# Patient Record
Sex: Male | Born: 1970 | Race: White | Hispanic: No | Marital: Married | Smoking: Never smoker
Health system: Western US, Community
[De-identification: ages and names within clinical notes are randomized; demographics above are authoritative.]

---

## 2019-10-26 ENCOUNTER — Ambulatory Visit: Admit: 2019-10-26 | Payer: BLUE CROSS/BLUE SHIELD | Attending: Otolaryngology/Facial Plastic Surgery

## 2019-10-26 DIAGNOSIS — R590 Localized enlarged lymph nodes: Secondary | ICD-10-CM

## 2019-10-26 NOTE — Addendum Note (Signed)
Addended by: Amandy Chubbuck on: 10/26/2019 06:47 PM     Modules accepted: Orders

## 2019-10-26 NOTE — H&P (Signed)
Chief Complaint   Patient presents with   . Enlarged Lymph nodes     New Patient        HISTORY OF PRESENT ILLNESS     Alan Hughes is a 48 y.o. male who has been referred by Rolin Barry, [facility] for evaluation of a mass that was discovered approximately 6 months ago by his wife. Symptoms are described as swelling. His symptoms are located on the left neck.     The symptoms affect the patient in the following ways: uncomfortable and concerning. He denies pain but reports uncomfortable feeling like a cramped muscle. Symptoms are constant. The symptoms are aggravated by movement . The symptoms are alleviated with decreasing movement and ROM.  He reports that he has also had unexplained weight loss and the neck swelling has been enlarging. He states that he has lost 22 lbs in the last 1 month and up to 10 lbs over 5 months previous to that. Patient specifically denies pain, redness or discoloration of the skin, tenderness with eating, difficulty opening mouth, difficulty swallowing. Patient states he was talking on the phone and walking at work yesterday when he had an episode of syncope that did cause him to collapse and he lost consciousness. He started to twitch as if he was having a seizure. He was transported to ED for evaluation and that report is available in the chart. He also reports intermittent dizziness that causes him to feel a lightheaded and numbness feeling in the head and makes him shaky. He states that over the last several months he has been having severe night sweats to the point that he is drenched. He reports chills, weight loss and increased of lethargy. Small red dots have arrived on face and chest.     Previous diagnostic studies for the mass include: CT scan and  are available for chart review. He has not been evaluated by another specialist for enlarged lymph nodes.    Pertinent past medical history and surgical history include: Hypertension (medication controlled).    He uses smokeless tobacco  for the last 10 years.      RESULTS     Pertinent Imaging:              MEDICAL HX     Past Medical History:   Diagnosis Date   . Lymphadenopathy      History reviewed. No pertinent surgical history.  Family History   Problem Relation Age of Onset   . Alcohol abuse Mother    . Diabetes Mother    . Liver disease Mother    . Coronary artery disease Father    . Stroke Father    . Heart attack Father    . No Known Health Problems Son      Allergies   Allergen Reactions   . No Known Drug Allergies No known drug reaction     Outpatient Medications Marked as Taking for the 10/26/19 encounter (Office Visit) with Lorina Rabon, DO   Medication Sig Dispense Refill   . lisinopriL (ZESTRIL) 40 MG tablet Take 40 mg by mouth daily.     . methadone (DOLOPHINE) 10 MG tablet Take by mouth 2 times daily.       Social History     Socioeconomic History   . Marital status: Married     Spouse name: Not on file   . Number of children: Not on file   . Years of education: Not on file   . Highest education  level: Not on file   Tobacco Use   . Smoking status: Never Smoker   . Smokeless tobacco: Current User     Types: Chew   . Tobacco comment: no smoke exposure   Substance and Sexual Activity   . Alcohol use: Not on file   . Drug use: Not on file   . Sexual activity: Not on file   Other Topics Concern   . Not on file     Social History     Tobacco Use   Smoking Status Never Smoker   Smokeless Tobacco Current User   . Types: Chew   Tobacco Comment    no smoke exposure       REVIEW OF SYSTEMS     Review of Systems   Constitutional: Positive for diaphoresis and weight loss.   HENT: Negative.    Eyes: Negative.    Respiratory: Positive for shortness of breath.    Cardiovascular: Negative.    Musculoskeletal: Positive for back pain.   Skin: Negative.    Neurological: Positive for dizziness, focal weakness and weakness.     12 point review of systems negative except as noted above.    PHYSICAL EXAM     Body mass index is 25.5 kg/m?Marland Kitchen  Vitals:     10/26/19 1000   Temp: 36.8 ?C (98.3 ?F)   TempSrc: Temporal   Weight: 188 lb (85.3 kg)   Height: 6' (1.829 m)       PHYSICAL EXAMINATION:    CONSTITUTIONAL:  General Appearance: well nourished, well-developed, alert, oriented, in no acute distress, cooperative with examination  Communication Ability / Voice Quality : communication ability normal, voice quality normal, English     HEAD:  Inspection : normocephalic; symmetrical, no lesions present, no evidence of trauma     FACE:  Inspection : SMALL HEMANGIOMAS ACROSS THE LEFT AND RIGHT TEMPLES , no evidence of trauma, jaw position normal   Facial Strength : facial motion symmetric, normal eye closure strength bilaterally   Parotid Glands : no tenderness on palpation, no swelling present, no masses present     EYES:  Ocular Motility/Alignment : ocular alignment normal, ocular motility normal, no nystagmus present, no proptosis present   Eyelids : eyelids within normal limits, lacrimal glands within normal limits, orbits within normal limits, no proptosis present   Conjunctiva : conjunctiva normal     EARS:  Hearing : hearing to conversational voice intact   External Ears : auricles anatomically normal bilaterally, no auricle lesions present, no auricle tenderness to palpation present   External Auditory Canals: ear canals with no significant cerumen, external auditory canals have normal appearance, no external auditory canal lesions present  Otoscopic/Microscopic Exam :   Right ear: tympanic membranes appear normal, no tympanic membrane lesions present, no tympanic membrane perforations present, no fluid present behind tympanic membranes  Left ear: tympanic membranes appear normal, no tympanic membrane lesions present, no tympanic membrane perforations present, no fluid present behind tympanic membranes  Vestibular System : Normal     NOSE / NASOPHARYNX:  External Nose : appearance normal, no tenderness on palpation, no significant nasal discharge present, no lesions,  no evidence of trauma, nostrils patent without discharge   Intranasal Exam : MILD SEPTAL SHOW ON LEFT, nasal mucosa pink, moist and within normal limits, vestibule within normal limits, inferior turbinates appear normal, middle turbinates appear normal, nasal discharge is clear and minimal, no polyposis seen     ORAL CAVITY / OROPHARYNX:  Lips : upper  and lower lips pink, moist, and normal appearing.   Teeth : CARIES LEFT PREMOLAR ON MANDIBLE dentition within normal limits for age   Gums : gingivae healthy   Oral Mucosa : oral mucosa pink and moist with no lesions present   Floor of Mouth : floor of mouth within normal limits, salivary ducts appear patent   Tongue : tongue moist, midline, with symmetrical movements with no lesions seen.   Palate : soft and hard palates within normal limits   Oropharynx : appearance within normal limits, tonsils normal in appearance, peritonsillar regions within normal limits     NECK:  Inspection and Palpation : LEFT POSTERIOR TRIANGLE LEVEL 3 LYMPH NODE  appearance normal, no masses or tenderness on palpation; trachea midline and freely movable   Thyroid : size of gland normal, no tenderness, nodules or mass present on palpation, position midline   Submandibular Glands : normal size, nontender to palpation   Lymph Nodes : no lymphadenopathy present     SKIN AND SUBCUTANEOUS TISSUES:  Skin : normal coloration and skin turgor for age, no rashes     NEUROLOGICAL / PSYCHIATRIC:  Orientation : oriented to time, place and person   Mood and Affect : mood normal, affect appropriate   Cranial Nerves : CN II-XII appear intact   Gait : gait normal     IN OFFICE PROCEDURES     NONE    ASSESSMENT       ICD-10-CM    1. Enlarged lymph node in neck  R59.0 Thyroid Stimulating Hormone -Routine     T4, Free -Routine   2. No passive smoke exposure  Z78.9    3. Lethargy  R53.83 Thyroid Stimulating Hormone -Routine     T4, Free -Routine        PLAN     1. Schedule excisional biopsy of left neck.  Procedure explained in detail including pre-op and post-op expectations. PARQ session was held: Risks and benefits were explained in full detail at that time to include, but not inclusive of bleeding, infection, hematoma, seroma,nerve damage, anesthesia risks need for secondary operation.  2. Reviewed CT scan and ultrasound performed at [facility]. Further imaging may need for full body scan depending on if biopsy comes back negative.   3. Order placed for TSH and T4 for further evaluation.     Patient instructions were provided and all questions answered.    Return for surgery.    I, Ernestyne Caldwell Allayne Butcher, DO, personally performed the services described in this documentation, as scribed by Elige Radon, CCMA in my presence, and it is both accurate and complete.    *This note was dictated using voice recognition software. If you have any questions concerning the content, please call our office at [phone].*

## 2019-10-27 NOTE — Procedures (Signed)
H&P dated 10/26/19, PARQ included, in chart review, notes.  Consent completed by patient and witness, in chart review, scans/media.  Orders in place.      Pt denies chest pain, difficulty with a flight of stairs, and orthopnea.    Pt denies cough, fever, respiratory symptoms.  Also denies recent travels, exposure to persons with COVID-19.    COVID screening test, TSH, T4 to be obtained on Mon 12/14 @ [facility]

## 2019-10-30 ENCOUNTER — Other Ambulatory Visit: Admit: 2019-10-30 | Discharge: 2019-10-30 | Payer: BLUE CROSS/BLUE SHIELD

## 2019-10-30 DIAGNOSIS — Z01818 Encounter for other preprocedural examination: Secondary | ICD-10-CM

## 2019-10-30 LAB — TSH: TSH - Thyroid Stimulating Hormone: 0.98 u[IU]/mL (ref 0.45–5.33)

## 2019-10-30 LAB — T4, FREE: T4, Free: 1 ng/dL (ref 0.6–1.2)

## 2019-10-30 LAB — SARS-COV-2 (COVID-19), SCREENING/ASYMPTOMATIC UNEXPOSED: SARS-CoV-2 (COVID-19) by RT-PCR: NOT DETECTED

## 2019-10-31 NOTE — Telephone Encounter (Signed)
Patient called in to find out if he will need hypaclean to wash the area before his biopsy tomorrow 11/01/19. If so, a member staff at [facility] can supply it to him if needed.    Please call back at [phone]    Talked to [place] MA via blue phone who advised that he does not need it.

## 2019-11-01 LAB — TISSUE EXAM

## 2019-11-01 MED ORDER — ondansetron (ZOFRAN) injection 4 mg
4 | Freq: Four times a day (QID) | INTRAMUSCULAR | Status: DC | PRN
Start: 2019-11-01 — End: 2019-11-01

## 2019-11-01 MED ORDER — ondansetron (ZOFRAN ODT) 4 MG disintegrating tablet
4 | ORAL_TABLET | Freq: Three times a day (TID) | ORAL | 0 refills | Status: DC | PRN
Start: 2019-11-01 — End: 2019-11-01

## 2019-11-01 MED ORDER — propofoL (DIPRIVAN) injection
10 | INTRAVENOUS | Status: DC | PRN
Start: 2019-11-01 — End: 2019-11-01
  Administered 2019-11-01: 17:00:00 10 mg/mL via INTRAVENOUS

## 2019-11-01 MED ORDER — HYDROcodone-acetaminophen (NORCO) 5-325 mg per tablet
5-325 | ORAL_TABLET | Freq: Four times a day (QID) | ORAL | 0 refills | 30.00000 days | Status: DC | PRN
Start: 2019-11-01 — End: 2019-11-01

## 2019-11-01 MED ORDER — diphenhydrAMINE (BENADRYL) injection 25 mg
50 | INTRAMUSCULAR | Status: DC | PRN
Start: 2019-11-01 — End: 2019-11-01

## 2019-11-01 MED ORDER — fentaNYL (PF) (SUBLIMAZE) injection 25-50 mcg
50 | INTRAMUSCULAR | Status: DC | PRN
Start: 2019-11-01 — End: 2019-11-01

## 2019-11-01 MED ORDER — ondansetron (ZOFRAN) injection
4 | INTRAMUSCULAR | Status: DC | PRN
Start: 2019-11-01 — End: 2019-11-01
  Administered 2019-11-01: 18:00:00 4 mg/2 mL via INTRAVENOUS

## 2019-11-01 MED ORDER — ketorolac (TORADOL) 15 mg/mL injection 15 mg
15 | Freq: Once | INTRAMUSCULAR | Status: DC
Start: 2019-11-01 — End: 2019-11-01

## 2019-11-01 MED ORDER — ketorolac (TORADOL) 15 mg/mL injection
15 | INTRAMUSCULAR | Status: AC
Start: 2019-11-01 — End: ?

## 2019-11-01 MED ORDER — ondansetron (ZOFRAN ODT) 4 MG disintegrating tablet
4 | ORAL_TABLET | Freq: Three times a day (TID) | ORAL | 0 refills | Status: AC | PRN
Start: 2019-11-01 — End: 2019-11-08

## 2019-11-01 MED ORDER — sodium chloride 0.9 % (NS flush) syringe 10 mL
INTRAMUSCULAR | Status: DC | PRN
Start: 2019-11-01 — End: 2019-11-01

## 2019-11-01 MED ORDER — lidocaine (PF) (XYLOCAINE) 20 mg/mL (2 %) injection
20 | INTRAMUSCULAR | Status: AC
Start: 2019-11-01 — End: ?

## 2019-11-01 MED ORDER — fentaNYL (PF) (SUBLIMAZE) injection 50-100 mcg
50 | INTRAMUSCULAR | Status: DC | PRN
Start: 2019-11-01 — End: 2019-11-01

## 2019-11-01 MED ORDER — ondansetron (ZOFRAN) 4 mg/2 mL injection
4 | INTRAMUSCULAR | Status: AC
Start: 2019-11-01 — End: ?

## 2019-11-01 MED ORDER — Lactated Ringers infusion
INTRAVENOUS | Status: DC
Start: 2019-11-01 — End: 2019-11-01
  Administered 2019-11-01 (×2): via INTRAVENOUS

## 2019-11-01 MED ORDER — lidocaine (PF) (XYLOCAINE) 20 mg/mL (2 %) injection
20 | INTRAMUSCULAR | Status: DC | PRN
Start: 2019-11-01 — End: 2019-11-01
  Administered 2019-11-01: 17:00:00 20 mg/mL (2 %) via INTRAVENOUS

## 2019-11-01 MED ORDER — meperidine (PF) (DEMEROL) injection 12.5 mg
25 | INTRAMUSCULAR | Status: DC | PRN
Start: 2019-11-01 — End: 2019-11-01

## 2019-11-01 MED ORDER — lidocaine-EPINEPHrine (XYLOCAINE W/EPI) 1 %-1:100,000 injection
1 | INTRAMUSCULAR | Status: DC | PRN
Start: 2019-11-01 — End: 2019-11-01
  Administered 2019-11-01: 18:00:00 1 %-:00,000 via INTRADERMAL

## 2019-11-01 MED ORDER — dexAMETHasone (DECADRON) 4 mg/mL injection
4 | INTRAMUSCULAR | Status: AC
Start: 2019-11-01 — End: ?

## 2019-11-01 MED ORDER — HYDROcodone-acetaminophen (NORCO) 5-325 mg per tablet
5-325 | ORAL_TABLET | Freq: Four times a day (QID) | ORAL | 0 refills | 30.00000 days | Status: AC | PRN
Start: 2019-11-01 — End: 2019-11-08

## 2019-11-01 MED ORDER — sodium chloride 0.9 % (NS flush) syringe 10 mL
INTRAMUSCULAR | Status: DC
Start: 2019-11-01 — End: 2019-11-01

## 2019-11-01 MED ORDER — dexAMETHasone (DECADRON) injection
4 | INTRAMUSCULAR | Status: DC | PRN
Start: 2019-11-01 — End: 2019-11-01
  Administered 2019-11-01: 17:00:00 4 mg/mL via INTRAVENOUS

## 2019-11-01 MED ORDER — Lactated Ringers infusion
INTRAVENOUS | Status: AC
Start: 2019-11-01 — End: ?

## 2019-11-01 MED ORDER — ketorolac (TORADOL) 15 mg/mL injection
15 | INTRAMUSCULAR | Status: DC | PRN
Start: 2019-11-01 — End: 2019-11-01
  Administered 2019-11-01: 18:00:00 15 mg/mL via INTRAVENOUS

## 2019-11-01 MED ORDER — propofoL (DIPRIVAN) 10 mg/mL injection
10 | INTRAVENOUS | Status: AC
Start: 2019-11-01 — End: ?

## 2019-11-01 MED ORDER — sodium chloride (NS) 0.9% irrigation
0.9 | Status: DC | PRN
Start: 2019-11-01 — End: 2019-11-01
  Administered 2019-11-01: 18:00:00 0.9 % irrigation

## 2019-11-01 MED FILL — LACTATED RINGERS INTRAVENOUS SOLUTION: INTRAVENOUS | Qty: 1000

## 2019-11-01 MED FILL — XYLOCAINE-MPF 20 MG/ML (2 %) INJECTION SOLUTION: 20 mg/mL (2 %) | INTRAMUSCULAR | Qty: 5

## 2019-11-01 MED FILL — ONDANSETRON HCL (PF) 4 MG/2 ML INJECTION SOLUTION: 4 | INTRAMUSCULAR | Qty: 2

## 2019-11-01 MED FILL — DIPRIVAN 10 MG/ML INTRAVENOUS EMULSION: 10 mg/mL | INTRAVENOUS | Qty: 20

## 2019-11-01 MED FILL — DEXAMETHASONE SODIUM PHOSPHATE 4 MG/ML INJECTION SOLUTION: 4 mg/mL | INTRAMUSCULAR | Qty: 1

## 2019-11-01 MED FILL — KETOROLAC 15 MG/ML INJECTION SOLUTION: 15 mg/mL | INTRAMUSCULAR | Qty: 15

## 2019-11-01 NOTE — Op Note (Signed)
DATE OF PROCEDURE:  11/01/2019    PROCEDURE:  Deep neck lymph node biopsy of left posterior triangle of the left   neck.    PREOPERATIVE DIAGNOSES:  1.  Enlarged lymph node of the left neck.  2.  Night sweats, weight loss and chills.    POSTOPERATIVE DIAGNOSES:  1.  Enlarged lymph node of the left neck.  2.  Night sweats, weight loss and chills.    SURGEON:  Lorina Rabon, DO.    ANESTHESIA PROVIDER:  Olin Pia, DO.    SPECIMEN:  Posterior triangle, left neck mass.    INCISION TIME:  0913    END TIME:  0933.    ESTIMATED BLOOD LOSS:  Minimal.    FINDINGS:  Left neck enlarged lymph node.    COMPLICATIONS:  None.    DISPOSITION:  To PACU in a hemodynamically stable condition.    PREOPERATIVE:  The patient was seen and examined in outpatient setting.  It was   determined at that time, the patient would benefit from the aforementioned   procedure.  Risks and benefits of the procedure explained in full detail at the   time including, but not inclusive of, bleeding, infection, hematoma, seroma,   nerve damage, need for secondary operation.    DESCRIPTION OF PROCEDURE:  On the day surgery, consent was signed and placed in   chart.  The patient was then brought back by anesthesia, placed on the OP table   in the supine position, administered appropriate general endotracheal   anesthesia.  Left neck lymph node was demarcated.  Lidocaine 1% with epinephrine  was injected below this demarcation in the dermal layer.  The left neck was then  prepped and draped in normal sterile fashion.    A 3.5 cm incision was carried out through the skin and subcutaneous tissue and   through the deep tissues of the superficial adipose tissue.  From here, tenotomy  scissors were used to spread adipose tissue through the approximate layer of   platysma.  This is where the accessory nerve was encountered and preserved.    Anterior to the accessory nerve, the lymph node was palpated and dissection   bluntly down to this with the tenotomy  scissors was accomplished.    Circumferentially, the lymph node was mobilized from the surrounding soft   tissue.  Using bipolar forceps and tenotomy scissors, this was released from the  surrounding fascia.  The wound was irrigated with copious amounts of saline.    The wound was reapproximated on the deep surface with 3-0 interrupted Vicryl   sutures x3.  Deep dermis was reapproximated using 4-0 interrupted Vicryl suture.   Skin glue, Steri-Strips and pressure dressing were then placed.  The patient   was awakened in the OR to regain reflexes, extubated and sent to postanesthesia   care unit in a stable condition.    Jerine Surles, DO    RH/nlrowemt  D:  11/01/2019  2:27 P   T:  11/01/2019  5:29 P   TID:  [id number]  RECEIPT:    [id number]

## 2019-11-01 NOTE — H&P (Signed)
No interval change to H&P for date of surgery

## 2019-11-01 NOTE — Discharge Instructions (Signed)
Patient Education   Excision of Lesions, Care After  Refer to this sheet in the next few weeks. These instructions provide you with information about caring for yourself after your procedure. Your health care provider may also give you more specific instructions. Your treatment has been planned according to current medical practices, but problems sometimes occur. Call your health care provider if you have any problems or questions after your procedure.  What can I expect after the procedure?  After your procedure, it is common to have pain or discomfort at the excision site.  Follow these instructions at home:  ? Take over-the-counter and prescription medicines only as told by your health care provider.  ? Follow instructions from your health care provider about:  ? How to take care of your excision site. You should keep the site clean, dry, and protected for at least 48 hours.  ? When and how you should change your bandage (dressing).  ? When you should remove your dressing.  ? Removing whatever was used to close your excision site.  ? Check the excision area every day for signs of infection. Watch for:  ? Redness, swelling, or pain.  ? Fluid, blood, or pus.  ? For bleeding, apply gentle but firm pressure to the area using a folded towel for 20 minutes.  ? Avoid high-impact exercise and activities until the stitches (sutures) are removed or the area heals.  ? Follow instructions from your health care provider about how to minimize scarring. Avoid sun exposure until the area has healed. Scarring should lessen over time.  ? Keep all follow-up visits as told by your health care provider. This is important.  Contact a health care provider if:  ? You have a fever.  ? You have redness, swelling, or pain at the excision site.  ? You have fluid, blood, or pus coming from the excision site.  ? You have ongoing bleeding at the excision site.  ? You have pain that does not improve in 2-3 days after your procedure.  ? You  notice skin irregularities or changes in sensation.  This information is not intended to replace advice given to you by your health care provider. Make sure you discuss any questions you have with your health care provider.  Document Released: 03/19/2015 Document Revised: 04/09/2016 Document Reviewed: 12/19/2014  Elsevier Interactive Patient Education ? 2019 Elsevier Inc[employer]     Patient Education     Moderate Conscious Sedation, Adult, Care After  These instructions provide you with information about caring for yourself after your procedure. Your health care provider may also give you more specific instructions. Your treatment has been planned according to current medical practices, but problems sometimes occur. Call your health care provider if you have any problems or questions after your procedure.  What can I expect after the procedure?  After your procedure, it is common:  ? To feel sleepy for several hours.  ? To feel clumsy and have poor balance for several hours.  ? To have poor judgment for several hours.  ? To vomit if you eat too soon.  Follow these instructions at home:  For at least 24 hours after the procedure:    ? Do not:  ? Participate in activities where you could fall or become injured.  ? Drive.  ? Use heavy machinery.  ? Drink alcohol.  ? Take sleeping pills or medicines that cause drowsiness.  ? Make important decisions or sign legal documents.  ? Take care  of children on your own.  ? Rest.  Eating and drinking  ? Follow the diet recommended by your health care provider.  ? If you vomit:  ? Drink water, juice, or soup when you can drink without vomiting.  ? Make sure you have little or no nausea before eating solid foods.  General instructions  ? Have a responsible adult stay with you until you are awake and alert.  ? Take over-the-counter and prescription medicines only as told by your health care provider.  ? If you smoke, do not smoke without supervision.  ? Keep all follow-up visits as told by  your health care provider. This is important.  Contact a health care provider if:  ? You keep feeling nauseous or you keep vomiting.  ? You feel light-headed.  ? You develop a rash.  ? You have a fever.  Get help right away if:  ? You have trouble breathing.  This information is not intended to replace advice given to you by your health care provider. Make sure you discuss any questions you have with your health care provider.  Document Released: 08/23/2013 Document Revised: 04/06/2016 Document Reviewed: 02/22/2016  Elsevier Interactive Patient Education ? 2019 Elsevier Inc.

## 2019-11-01 NOTE — Anesthesia Pre-Procedure Evaluation (Signed)
Anesthesia Evaluation     Patient summary reviewed and Nursing notes reviewed    No history of anesthetic complications     Airway   Mallampati: II  TM distance: 6.5 - 8 cm  Neck ROM: full  Dental    (+) age appropriate    Pulmonary - negative ROS and normal exam    breath sounds clear to auscultation  Cardiovascular - normal exam  Exercise tolerance: good (4-7 METS)  (+) hypertension,     Rhythm: regular  Rate: normal    Neuro/Psych - negative ROS     GI/Hepatic/Renal - negative ROS     Endo/Other - negative ROS     Comments: On methadone   Musculoskeletal - negative ROS                   Anesthesia Plan    ASA 2     general     intravenous induction         Anesthetic plan, risks and benefits discussed with patient.  Risks discussed included (but were not limited to)   General risks dental injury, nausea, pain, sore throat and respiratory eventsConsenting person understands and agrees to proceed. PARQ.  Pre-Anesthesia Evaluation Completed at:  11/01/2019 8:45 AM

## 2019-11-01 NOTE — OR Nursing (Addendum)
Pt arrived in recovery and report taken from OR nurse and anesthesia. VS obtained, WNL, and pt stable throughout recovery. Pt drinking and tolerating fluids w/no nausea. Pt stable for discharge home and all instructions reviewed w/patient verbally and written instructions given. Pt voices understanding. All questions answered. Pressure dressing removed from left neck per surgeon's request prior to discharge. Pt dressed with minimal assist, and is steady, balanced with being up. Discharging per protocol with all belongings. Pt ambulating to car w/one person SBA.

## 2019-11-01 NOTE — Brief Op Note (Addendum)
Brief Operative Note    Procedure(s) (LRB):  BIOPSY NECK SOFT TISSUE (Left)     Preoperative Diagnoses:   Enlarged lymph node in neck [R59.0]    Postoperative Diagnoses:  Post-Op Diagnosis Codes:     * Enlarged lymph node in neck [R59.0]     Surgeons: Surgeon(s) and Role:     * Lorina Rabon, DO - Primary    Assistant(s): * No surgical staff found *     Anesthesia Provider: Anesthesiologist: Olin Pia, DO    Anesthesia Type: Multiple (Refer to Case Comments)    Height & Weight:6' (1.829 m) & 185 lb (83.9 kg)     Specimens:   ID Type Source Tests Collected by Time Destination   A : Left neck soft tissue for flow cytometry Tissue Soft Tissue (specify site) TISSUE EXAM Lorina Rabon, DO 11/01/2019 1610               Implants:none    Antibiotics (admin):   Recent Abx Admin      No antibiotic adminstration found                Incision Time:   Anes Incision Time     Date Time Event    11/01/2019 0913 Incision / Procedure Start           Timeout Completed:  Timeouts     [name], RN at [facility] Nov 01, 2019 0908 PST     Timeout Details     Timeout type: Pre-incision                [name], RN at [facility] Nov 01, 2019 9604 PST     Timeout Details     Timeout type: Sign-out                       Estimated Blood Loss: * minimal - 11/01/2019  9:13 AM to 11/01/2019  9:33 AM *    Urethral Catheter:      Findings: left neck enlarged lymph node    Complications: None     Disposition: PACU - hemodynamically stable.  [id number]  Lorina Rabon

## 2019-11-01 NOTE — Anesthesia Post-Procedure Evaluation (Signed)
Patient Name: Alan Hughes  Procedures performed: Procedure(s):  BIOPSY NECK SOFT TISSUE    Last Vitals:   Vitals Value Taken Time   BP 107/58 11/01/19 0939   Pulse 65 11/01/19 0940   Resp 8 11/01/19 0940   SpO2 100 % 11/01/19 0940   Temp     Vitals shown include unvalidated device data.    Planned Anesthesia Type: general  Final Anesthesia Type: general  Patients Current Location: PACU  Level of Consciousness: awake and responds appropriately  Post Procedure Pain:adequate analgesia  Airway: Patent  Respiratory Status: room air  Cardio Status: hemodynamically stable  Hydration: adequately hydrated  PONV prophylaxis Ordered  no anesthesia complication,       planned opioid use           The patient was able to participate in the post op evaluation    Comments:      Olin Pia, DO  9:43 AM

## 2019-11-02 ENCOUNTER — Other Ambulatory Visit: Admit: 2019-11-02 | Discharge: 2019-11-02 | Payer: BLUE CROSS/BLUE SHIELD

## 2019-11-02 DIAGNOSIS — R590 Localized enlarged lymph nodes: Secondary | ICD-10-CM

## 2019-11-02 NOTE — Telephone Encounter (Signed)
Care call placed to patient following excisional biopsy of left neck performed on 11/01/19.    Left message on voicemail asking for return call if there are any questions or concerns regarding post-op care.    Patient is scheduled for post op visit on 11/13/19 with Dr. Allayne Butcher.

## 2019-11-13 ENCOUNTER — Encounter: Admit: 2019-11-13 | Payer: BLUE CROSS/BLUE SHIELD | Attending: Otolaryngology/Facial Plastic Surgery

## 2019-11-13 DIAGNOSIS — Z4889 Encounter for other specified surgical aftercare: Secondary | ICD-10-CM

## 2019-11-13 MED ORDER — amoxicillin-clavulanate (AUGMENTIN) 875-125 mg per tablet
875-125 | ORAL_TABLET | Freq: Two times a day (BID) | ORAL | 0 refills | 7.00000 days | Status: AC
Start: 2019-11-13 — End: 2019-11-27

## 2019-11-13 NOTE — Progress Notes (Signed)
Chief Complaint   Patient presents with   . Post-Op Visit     s/p excisional biopsy left neck lymph node 11/01/19     HISTORY OF PRESENT ILLNESS     Alan Hughes is a/an 48 y.o. male here s/p excisional biopsy of left neck enlarged lymph node performed by Dr. Allayne Butcher on 11/01/19  The surgical pathology demonstrates some reactive, nonspecific paracortical hyperplasia. No malignancy seen. He used/has been using pain medication for 0 days. He does not need a refill of pain medication. Patient reports symptoms today of fatigue.  Patient is here today for review of pathology and determine if further work-up or treatment is required.    MEDICAL HISTORY     Past Medical History:   Diagnosis Date   . Chronic pain     back   . Hypertension    . Lymphadenopathy      Past Surgical History:   Procedure Laterality Date   . BACK SURGERY      X4, lumbar   . COLONOSCOPY  2011   . ESOPHAGOGASTRODUODENOSCOPY  2011   . FOOT SURGERY Left     2nd and third toe partial removal d/t prior fx   . KNEE ARTHROSCOPY Right    . PROCEDURE Left 11/01/2019    Procedure: BIOPSY NECK SOFT TISSUE;  Surgeon: Lorina Rabon, DO;  Location: [facility] OR;  Service: Ear Nose and Throat;  Laterality: Left;     Family History   Problem Relation Age of Onset   . Alcohol abuse Mother    . Diabetes Mother    . Liver disease Mother    . Coronary artery disease Father    . Stroke Father    . Heart attack Father    . No Known Health Problems Son      Allergies   Allergen Reactions   . No Known Drug Allergies No known drug reaction     Outpatient Medications Marked as Taking for the 11/13/19 encounter (Post-Op) with Lorina Rabon, DO   Medication Sig Dispense Refill   . lisinopriL (ZESTRIL) 40 MG tablet Take 20 mg by mouth daily.      . methadone (DOLOPHINE) 10 MG tablet Take by mouth 2 times daily.       Social History     Socioeconomic History   . Marital status: Married     Spouse name: Not on file   . Number of children: Not on file   . Years of  education: Not on file   . Highest education level: Not on file   Tobacco Use   . Smoking status: Never Smoker   . Smokeless tobacco: Current User     Types: Chew   . Tobacco comment: no smoke exposure   Substance and Sexual Activity   . Alcohol use: Never     Frequency: Never   . Drug use: Never   . Sexual activity: Not on file   Other Topics Concern   . Not on file     Social History     Tobacco Use   Smoking Status Never Smoker   Smokeless Tobacco Current User   . Types: Chew   Tobacco Comment    no smoke exposure       REVIEW OF SYSTEMS     ROS    PHYSICAL EXAM     Body mass index is 25.09 kg/m?Marland Kitchen  Vitals:    11/13/19 1117   Temp: 36.6 ?C (97.9 ?F)   TempSrc:  Temporal   Weight: 185 lb (83.9 kg)   Height: 6' (1.829 m)       PHYSICAL EXAMINATION:    CONSTITUTIONAL:  General Appearance: well nourished, well-developed, alert, oriented, in no acute distress, cooperative with examination  Communication Ability / Voice Quality : communication ability normal, voice quality normal, English     FACE:  Inspection : normal appearance, no lesions present, no evidence of trauma, jaw position normal   Palpation : frontoethmoidal sinus nontender, maxillary sinuses nontender to palpation, no masses present   Facial Strength : facial motion symmetric, normal eye closure strength bilaterally   Parotid Glands : no tenderness on palpation, no swelling present, no masses present     EARS:  Hearing : hearing to conversational voice intact   External Ears : auricles anatomically normal bilaterally, no auricle lesions present, no auricle tenderness to palpation present   External Auditory Canals: ear canals with no significant cerumen, external auditory canals have normal appearance, no external auditory canal lesions present  Otoscopic/Microscopic Exam :   Right ear: tympanic membranes appear normal, no tympanic membrane lesions present, no tympanic membrane perforations present, no fluid present behind tympanic membranes  Left ear:  tympanic membranes appear normal, no tympanic membrane lesions present, no tympanic membrane perforations present, no fluid present behind tympanic membranes  Vestibular System : Normal     NOSE / NASOPHARYNX:  External Nose : appearance normal, no tenderness on palpation, no significant nasal discharge present, no lesions, no evidence of trauma, nostrils patent without discharge   Intranasal Exam : nasal mucosa pink, moist and within normal limits, vestibule within normal limits, inferior turbinates appear normal, middle turbinates appear normal, nasal septum relatively midline, nasal discharge is clear and minimal, no polyposis seen     ORAL CAVITY / OROPHARYNX:  Lips : upper and lower lips pink, moist, and normal appearing.   Teeth : dentition within normal limits for age   Gums : gingivae healthy   Oral Mucosa : oral mucosa pink and moist with no lesions present   Floor of Mouth : floor of mouth within normal limits, salivary ducts appear patent   Tongue : tongue moist, midline, with symmetrical movements with no lesions seen.   Palate : soft and hard palates within normal limits   Oropharynx : SMALL AMOUNT OF PURULENCE NOTED IN THE RIGHT OROPHARYNX.    NECK:  Inspection and Palpation : INCISION HEALING WELL IN THE LEFT POSTERIOR TRIANGLE OF NECK.  POSTOPERATIVE SWELLING NOTED AND APPROPRIATE FOR TIMEFRAME.  Thyroid : size of gland normal, no tenderness, nodules or mass present on palpation, position midline   Submandibular Glands : normal size, nontender to palpation   Lymph Nodes : no lymphadenopathy present     RESULTS     Tissue Exam (Surgical Pathology): [id number]  Order: [id number]  Collected:  11/01/2019 09:22   Status:  Edited Result - FINAL   Visible to patient:  No (not released)  Component    Final Pathologic Diagnosis   LYMPH NODE, LEFT NECK, EXCISION:    REACTIVE LYMPH NODE, SEE COMMENT.  ?  Comment:       The biopsy shows some reactive, nonspecific paracortical hyperplasia.  No malignancy is seen.   Recommend clinical correlation.       ?  ADDENDUM COMMENT (11/07/2019):  Flow cytometric from [facility] showed no immunophenotypic evidence of non-Hodgkin's lymphoma. Diagnosis unchanged.  ?   Amendment electronically signed by Wynona Dove, MD on 11/07/2019 at 1141   Electronically  signed by Wynona Dove, MD on 11/03/2019 at 1723           Labs:    TSH - Thyroid Stimulating Hormone 0.45 - 5.33 ?IU/mL 0.98       Specimen Collected: 10/30/19        T4, Free 0.6 - 1.2 ng/dL 1.0    Specimen Collected 10/30/19         IN OFFICE PROCEDURES       ASSESSMENT       ICD-10-CM    1. Postoperative visit  Z48.89    2. History of lymph node excision  Z98.890    3. Unexplained weight loss  R63.4    4. Other chronic sinusitis  J32.8 amoxicillin-clavulanate (AUGMENTIN) 875-125 mg per tablet     CT sinus without contrast stealth   5. No passive smoke exposure  Z78.9      PLAN     1.  Reviewed pathology with patient.  It was a benign reactive lymph node.  2.  Is unexplained weight loss and loss of appetite may be attributed to chronic sinus infection.  I will place him on Augmentin 875 twice daily x14 days.  After which we will obtain a CT scan of paranasal sinuses.  3.  Patient is moving to [place] in the next 3 to 4 weeks.  He was instructed to obtain a hard copy of the images in the form of a CD to take with him to [place].     Patient instructions were provided and all questions answered.    No follow-ups on file.    I, Evelette Hollern Allayne Butcher, DO, personally performed the services described in this documentation, and it is both accurate and complete.    *This note was dictated using voice recognition software. If you have any questions concerning the content, please call our office at [phone] .*

## 2019-11-14 NOTE — Telephone Encounter (Signed)
Patients wife Annice Pih called in regarding CT scan. She advised that [facility] has not received order.     Please call back at [phone]. Okay to leave a detailed message.

## 2019-11-14 NOTE — Telephone Encounter (Signed)
LM on VM informing that at this time we are still waiting on insurance authorization. Advised that scheduling will call patient to schedule once approved. Call if further questions.

## 2019-11-30 ENCOUNTER — Encounter: Payer: BLUE CROSS/BLUE SHIELD | Attending: Otolaryngology/Facial Plastic Surgery

## 2020-02-14 IMAGING — MG MAMMO DIAG BILAT DIGITAL W CAD
4 series · 4 of 4 positions shown · non-contrast
Comparison: None available.

INDICATION: Right breast lump.
TECHNIQUE: Bilateral 2-D digital diagnostic mammogram was performed. Current study was also evaluated with a computer aided detection (CAD) system.

[R MLO]
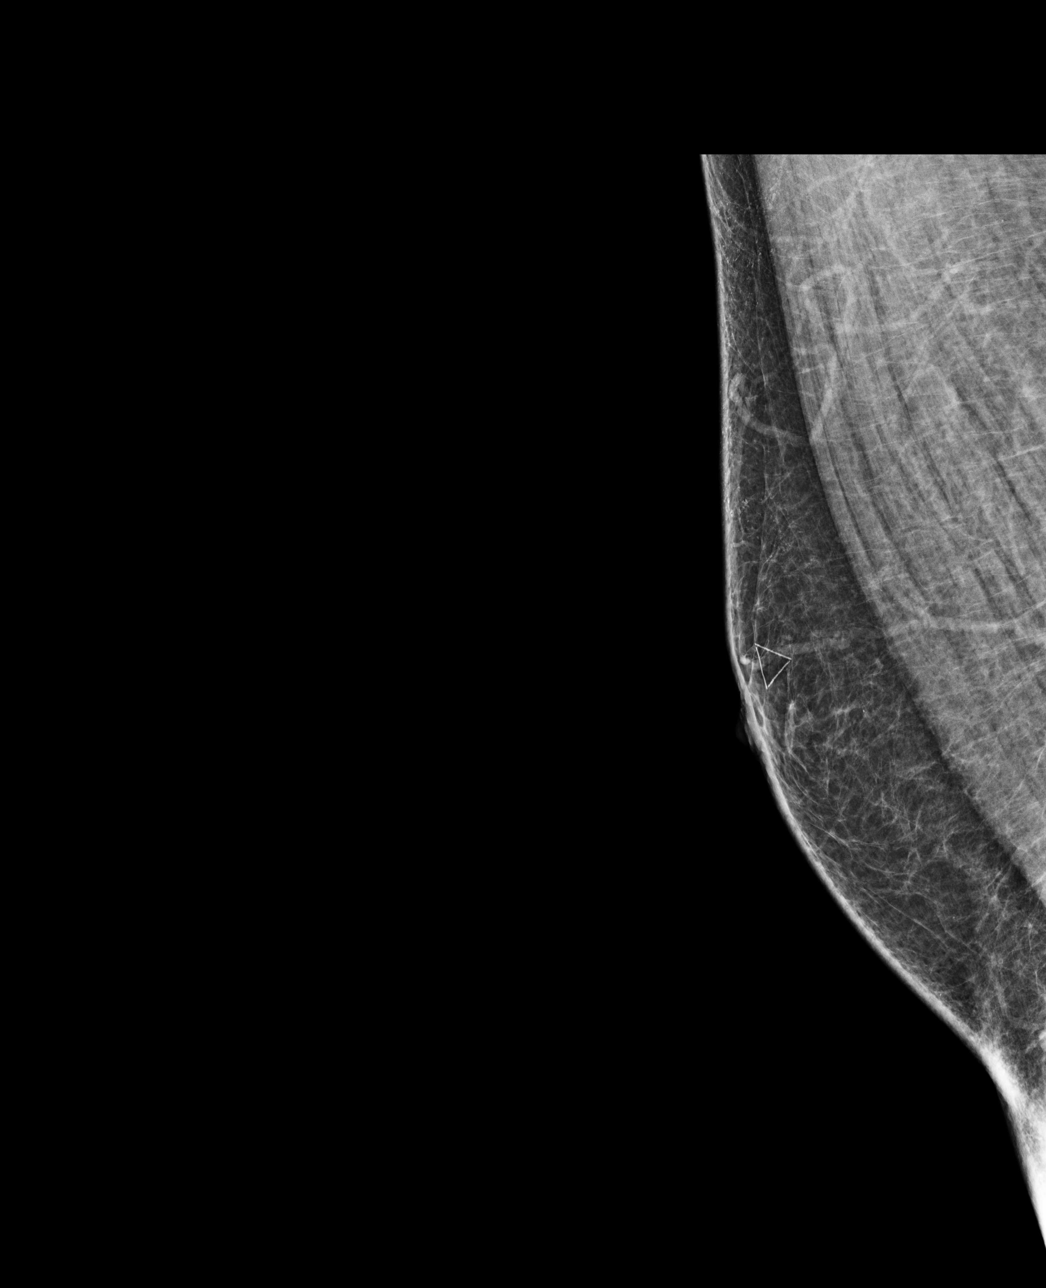

[L CC]
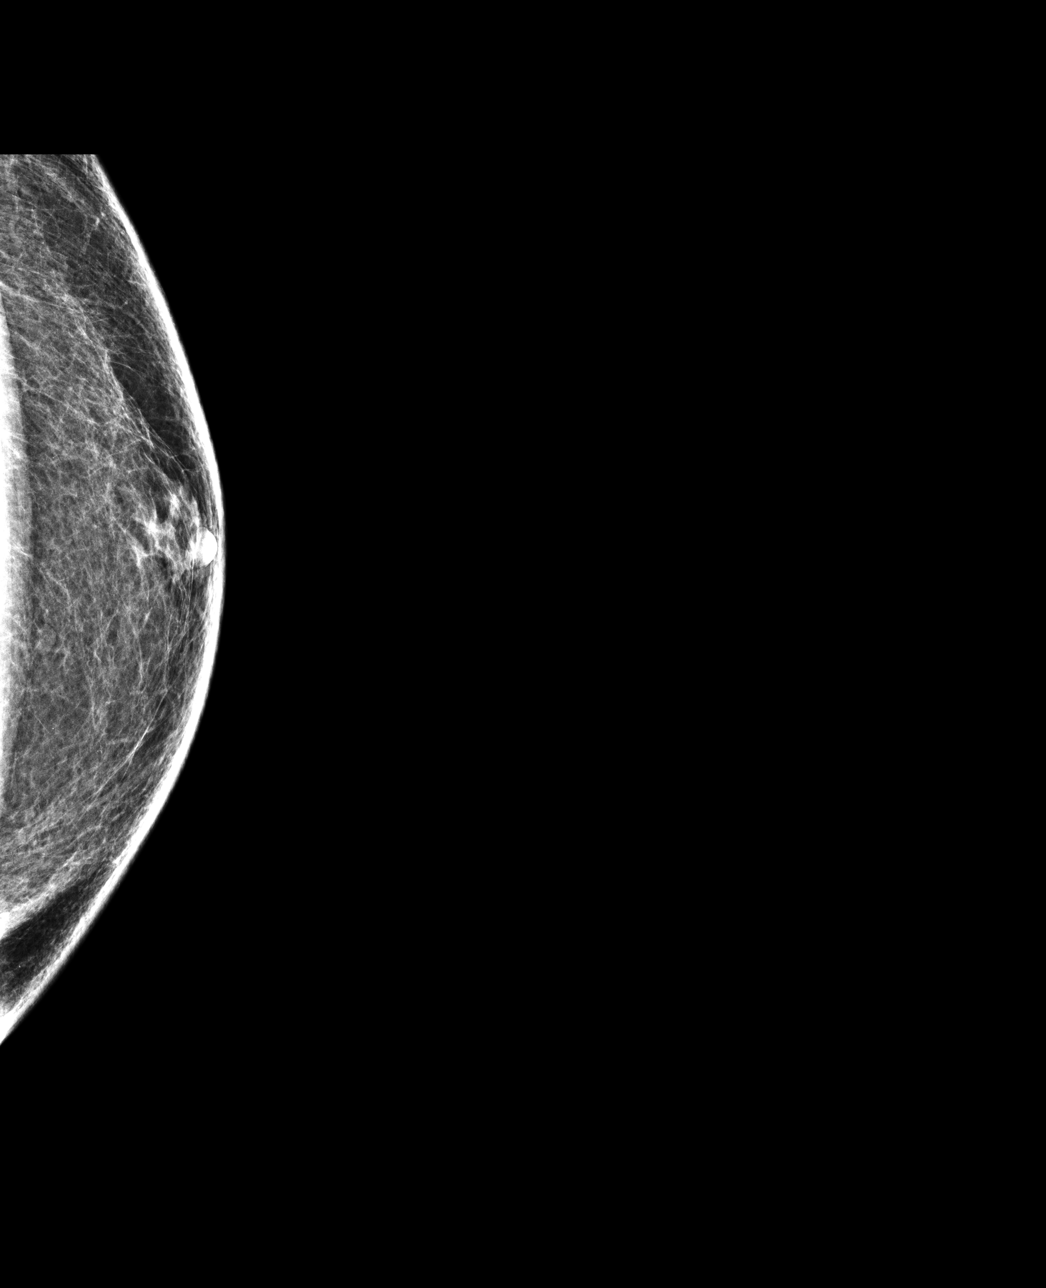

[L MLO]
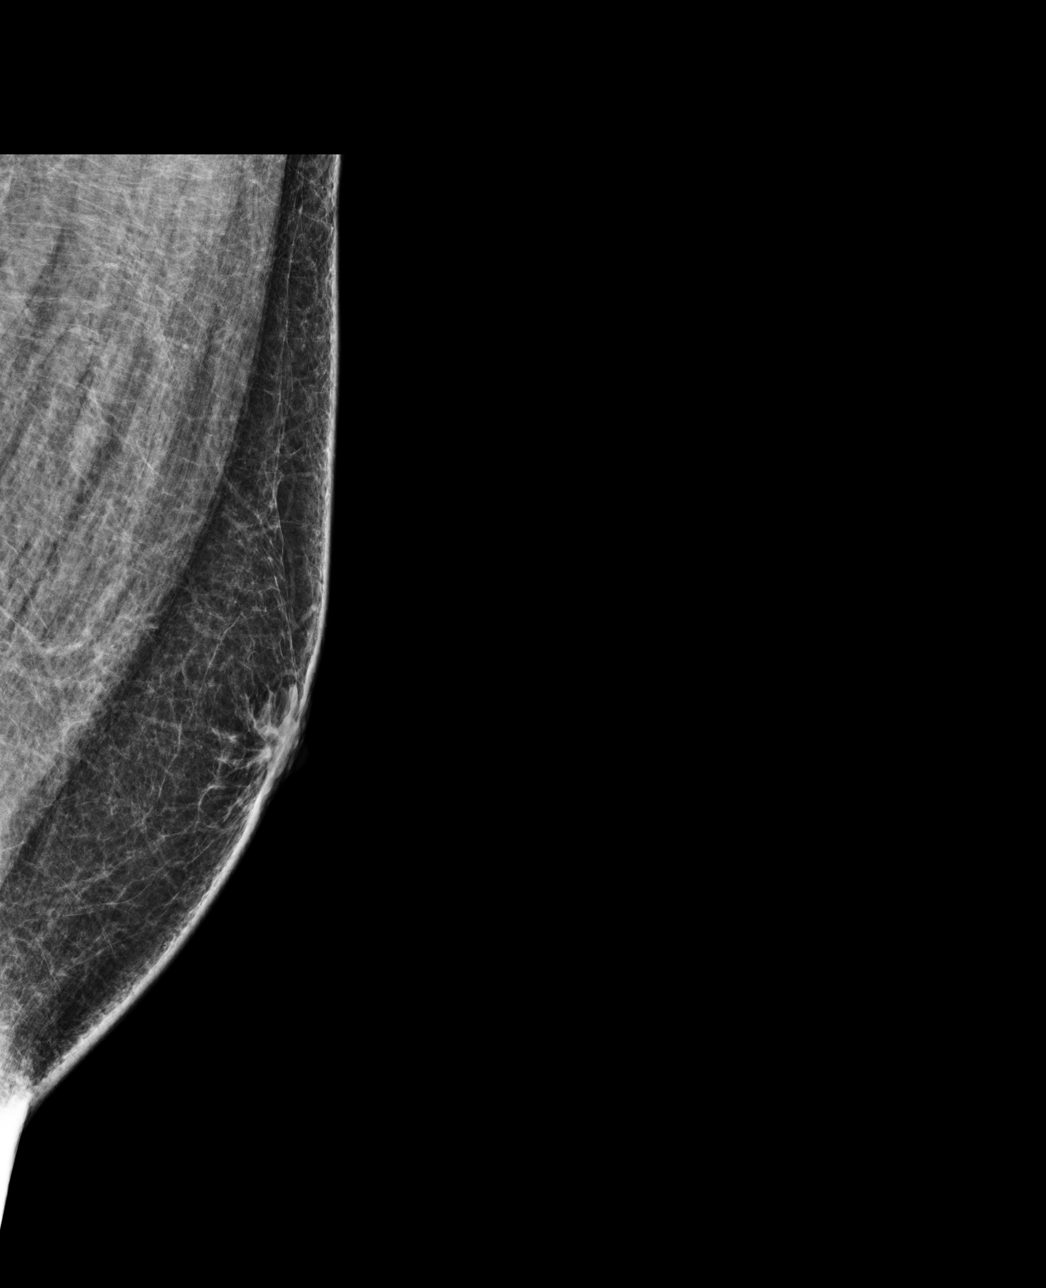

[R CC]
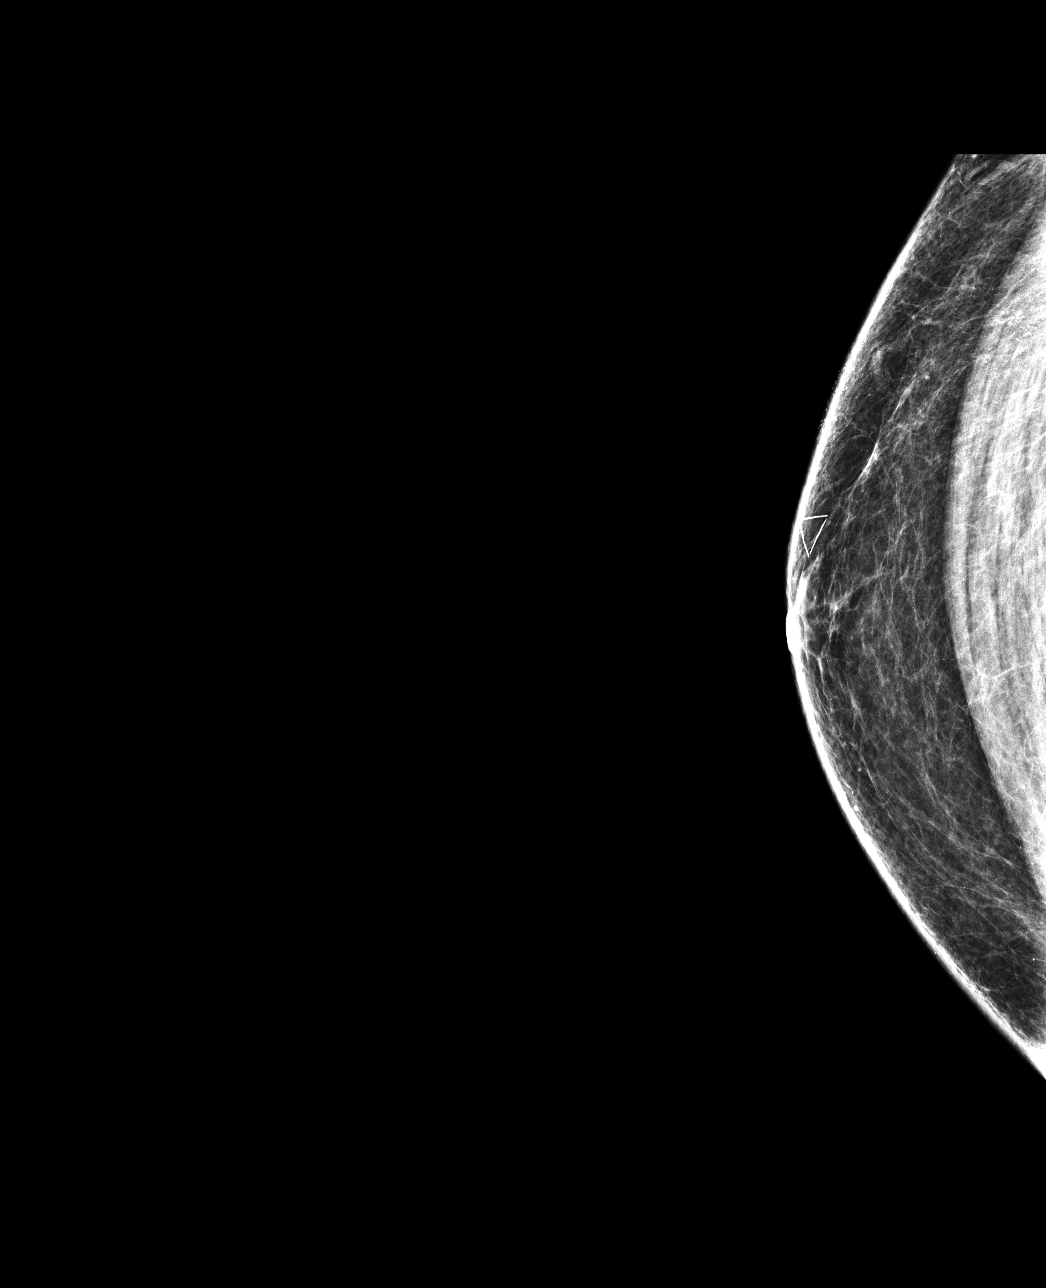

[4 of 4 positions shown; findings below may reference images not displayed]

FINDINGS: The breasts are almost entirely fatty. There is minimal bilateral gynecomastia. No suspicious abnormality is seen in either breast.
IMPRESSION: There is no mammographic evidence of malignancy. Minimal bilateral gynecomastia. Clinical follow-up recommended.

The patient received a copy of the results at the end of the examination. 

FINAL ASSESSMENT: BI-RADS: Category 2 Benign

## 2020-04-01 IMAGING — CR [HOSPITAL] L SPINE
2 series · 2 of 2 positions shown · non-contrast
Comparison: None

HISTORY: Low back pain, MRI comparison
TECHNIQUE: Lumbar spine 2 views, no charge

[t lumbar spine ap]
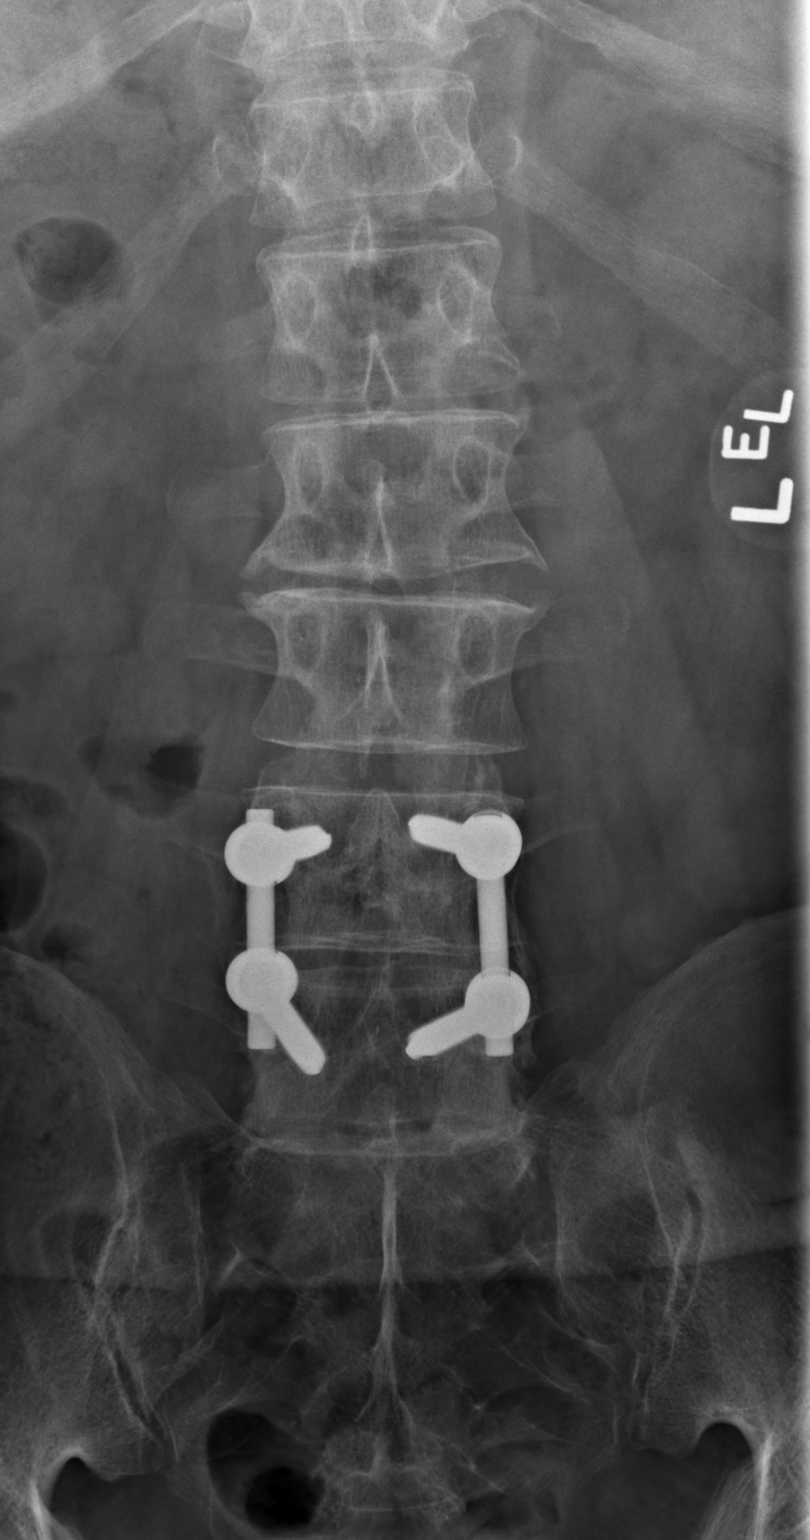

[t lumbar spine lat]
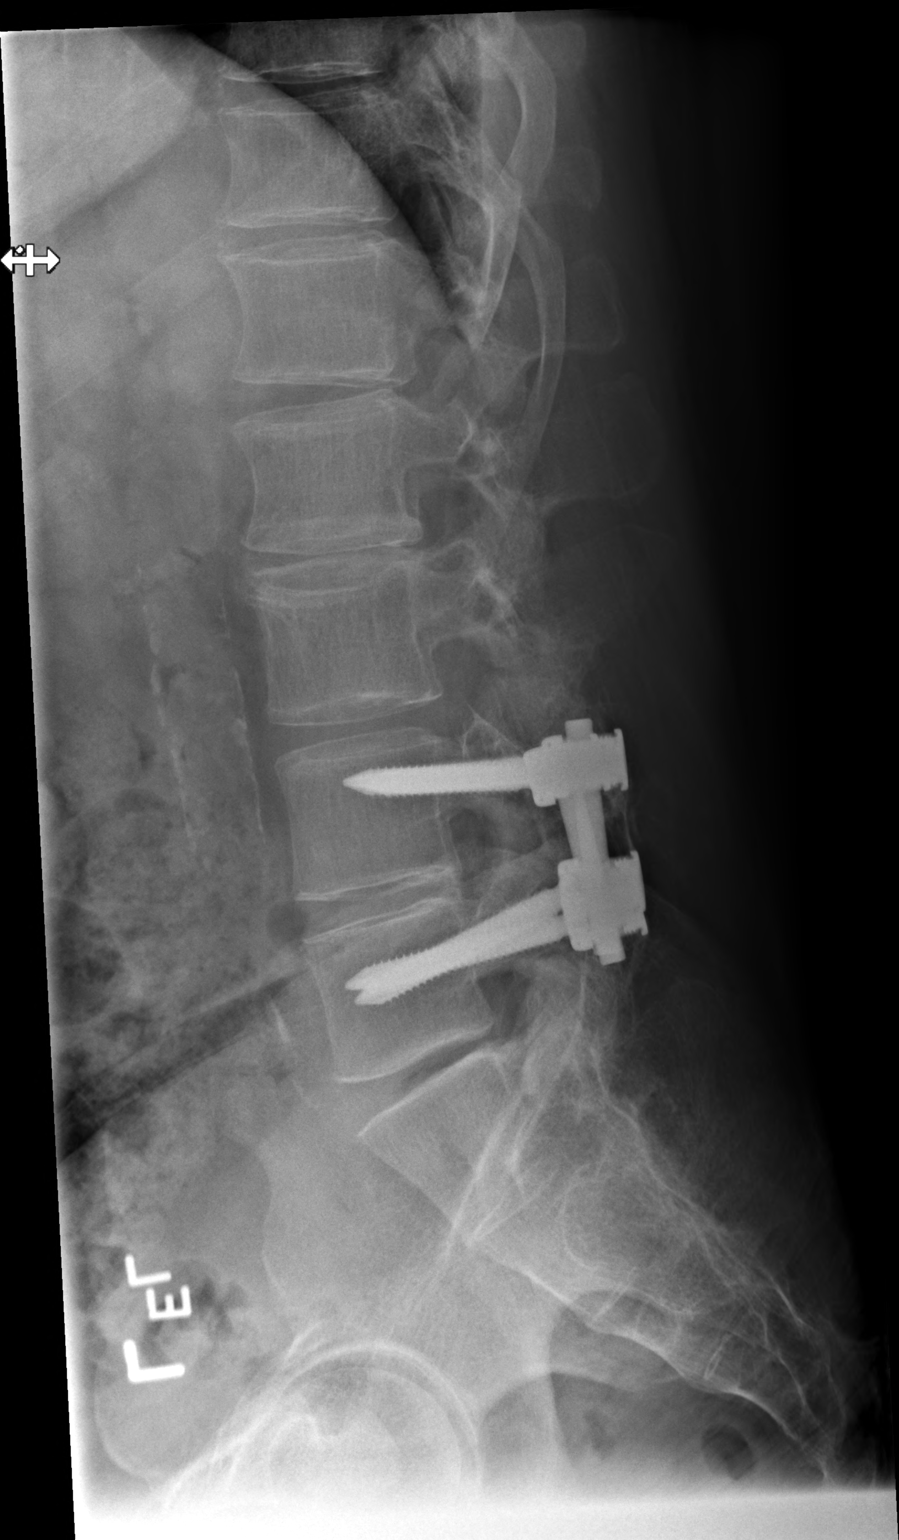

[2 of 2 positions shown; findings below may reference images not displayed]

FINDINGS: 2 view L-spine demonstrates total of 5 lumbar segments. Postop fusion L4-5 with pedicle screws and connecting rods. Minimal thoracolumbar levoscoliosis. No fractures. No destructive lesions. Degenerative loss of disc space height L5-S1. Mild facet arthropathy. Vascular calcifications of the aorta.
IMPRESSION: Postoperative changes and degenerative changes. 5 lumbar segments. Mild thoracolumbar levoscoliosis.

## 2020-04-01 IMAGING — MR MRI LSPINE WO CONTRAST
6 series · 48 of 48 positions shown · non-contrast
Comparison: Radiograph lumbar spine April 01, 2020

HISTORY: 48 year-old male with flat back syndrome. Low back pain and left lower extremity radiculopathy.
TECHNIQUE: Multiplanar, multisequential MR images of the lumbar spine were obtained without intravenous contrast.

[Series 1: bSSFP · axial · 8.0mm · 1.37mm/px · z∈[-61,+175]mm · 10 of 25 slices shown]
[im 1/25]
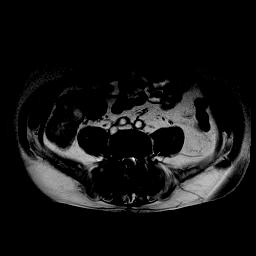
[im 3/25]
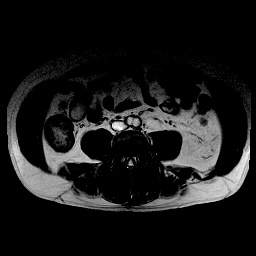
[im 6/25]
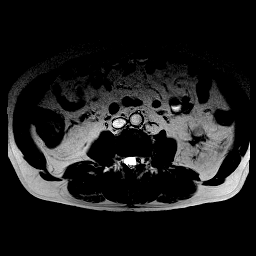
[im 9/25]
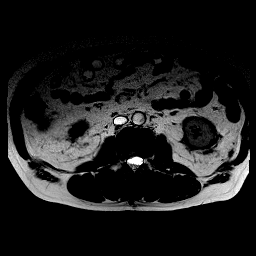
[im 11/25]
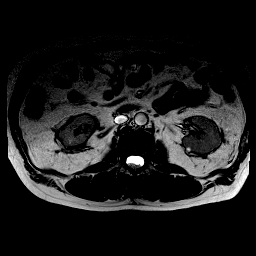
[im 14/25]
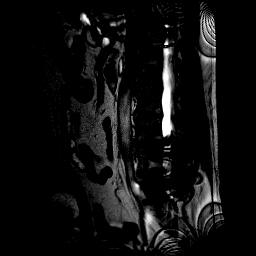
[im 17/25]
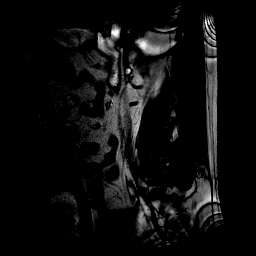
[im 19/25]
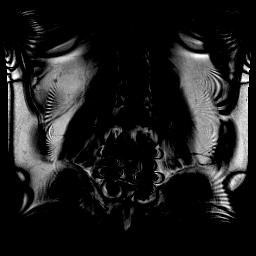
[im 22/25]
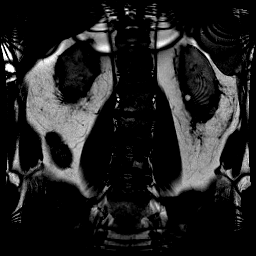
[im 25/25]
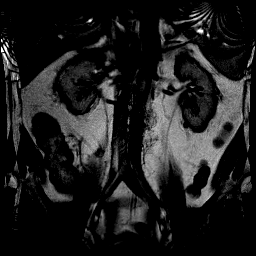

[Series 2: t2_sag_hbw · sagittal · 4.0mm · 0.47mm/px · 5 of 14 slices shown]
[im 1/14]
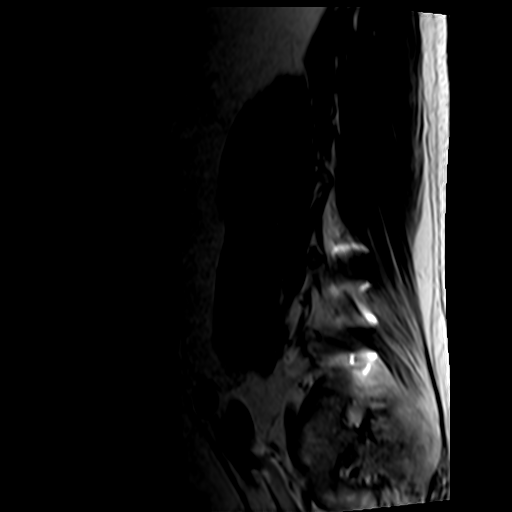
[im 4/14]
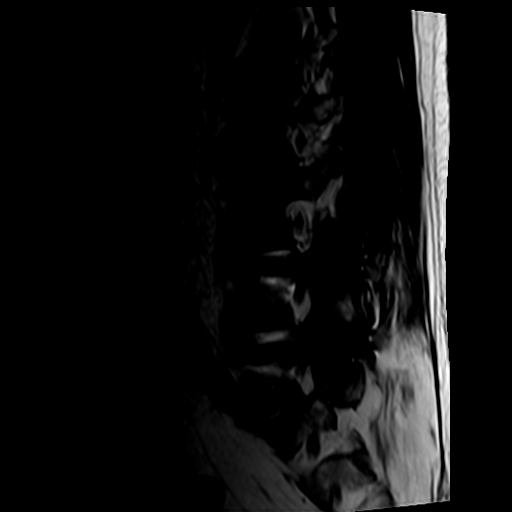
[im 7/14]
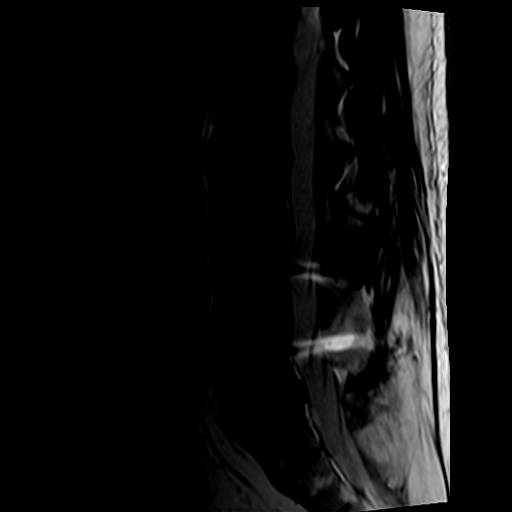
[im 10/14]
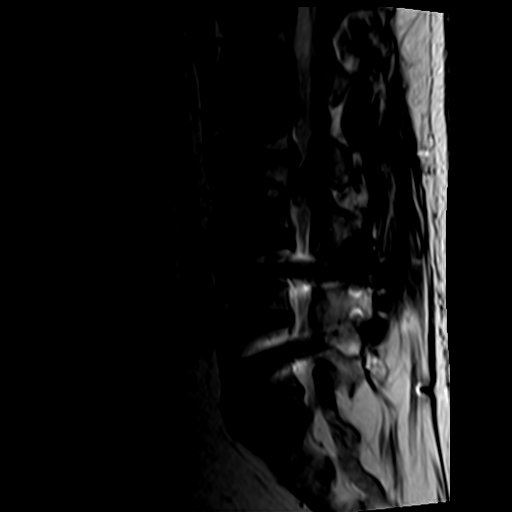
[im 14/14]
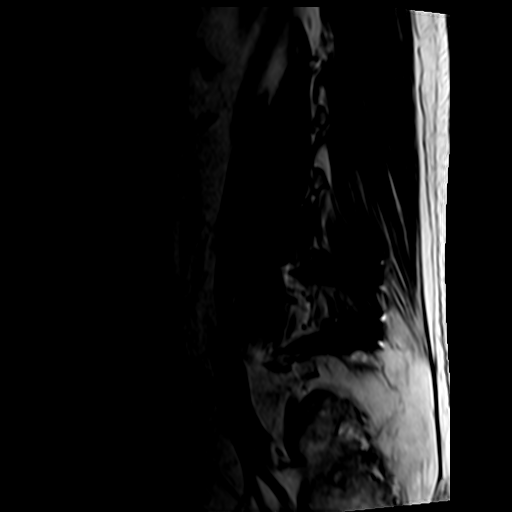

[Series 3: t1_sag_hbw · sagittal · 4.0mm · 0.75mm/px · 5 of 14 slices shown]
[im 1/14]
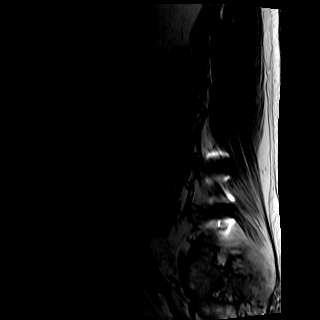
[im 4/14]
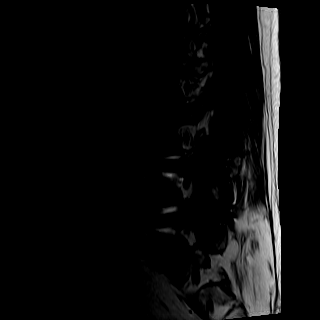
[im 7/14]
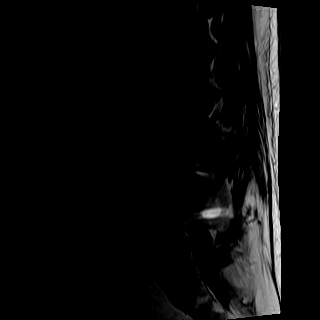
[im 10/14]
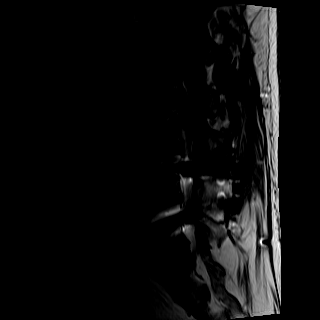
[im 14/14]
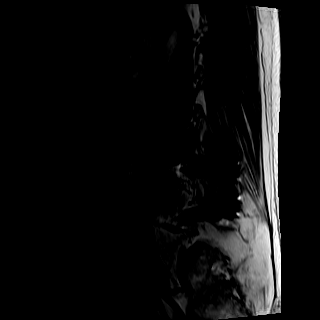

[Series 4: ir_sag_hbw · sagittal · 4.0mm · 0.94mm/px · 5 of 14 slices shown]
[im 1/14]
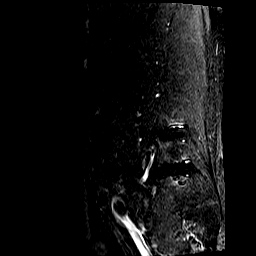
[im 4/14]
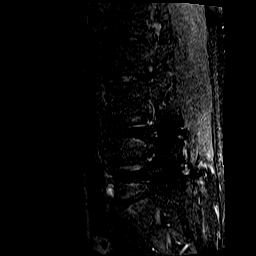
[im 7/14]
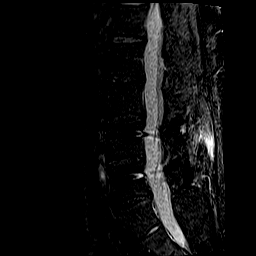
[im 10/14]
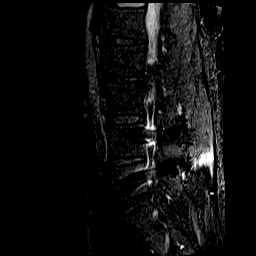
[im 14/14]
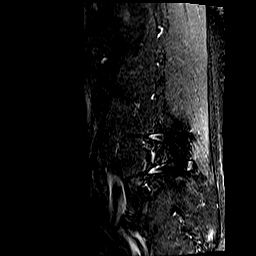

[Series 5: t1_axial_obl_hbw · axial · 4.0mm · 0.65mm/px · z∈[-109,+72]mm · 9 of 25 slices shown]
[im 1/25]
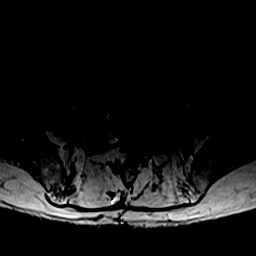
[im 4/25]
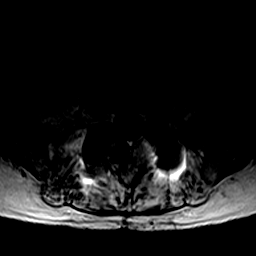
[im 7/25]
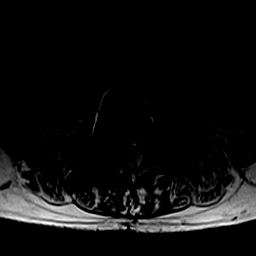
[im 10/25]
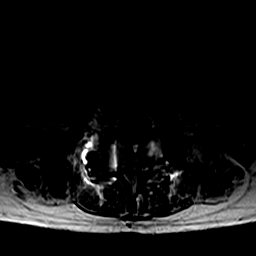
[im 13/25]
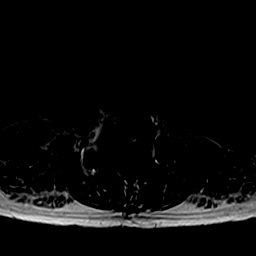
[im 16/25]
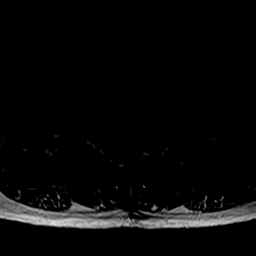
[im 19/25]
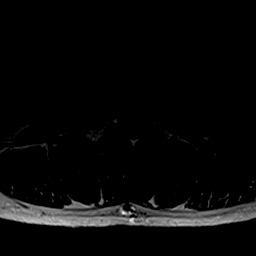
[im 22/25]
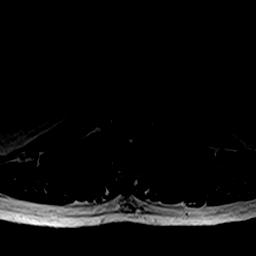
[im 25/25]
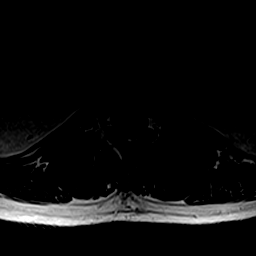

[Series 6: t2_axial_hbw · axial · 4.0mm · 0.64mm/px · z∈[-115,+64]mm · 14 of 37 slices shown]
[im 1/37]
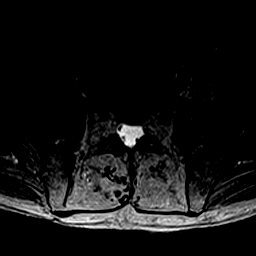
[im 3/37]
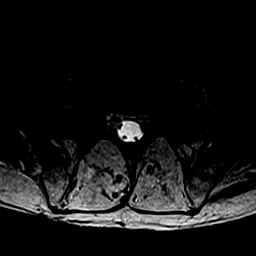
[im 6/37]
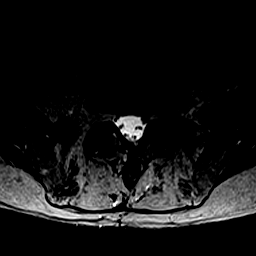
[im 9/37]
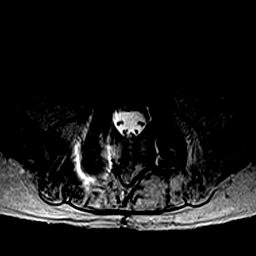
[im 12/37]
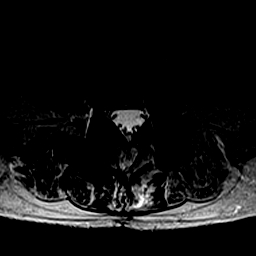
[im 14/37]
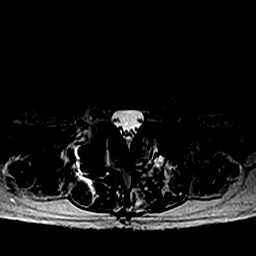
[im 17/37]
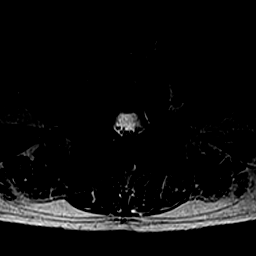
[im 20/37]
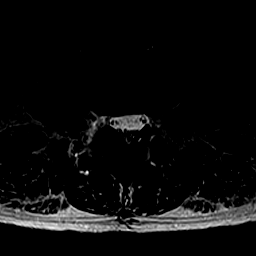
[im 23/37]
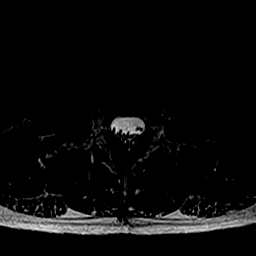
[im 25/37]
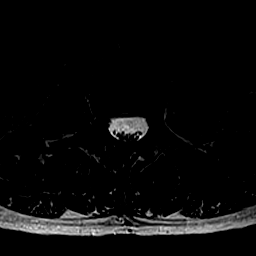
[im 28/37]
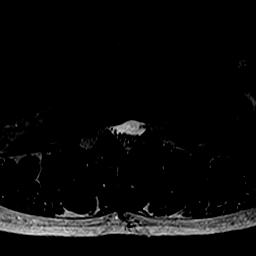
[im 31/37]
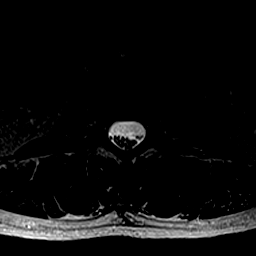
[im 34/37]
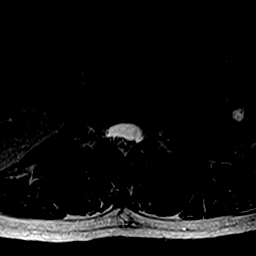
[im 37/37]
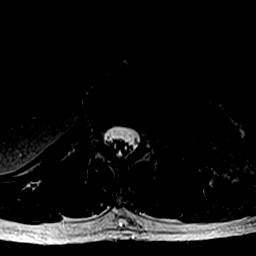

[48 of 48 positions shown; findings below may reference images not displayed]

FINDINGS: Post-surgical changes: Instrumented posterior lumbar spinal fusion at L4-L5.

Spine labeling: Based on assumption of 5 lumbar type non-rib bearing vertebrae.

Alignment: Shallow levoscoliosis. Straightening of the normal lumbar lordosis.  

Vertebrae and discs: Multilevel disc desiccation and disc space narrowing, most pronounced at L5-S1. Mild discogenic endplate change. 

Marrow: No suspicious osseous lesion or acute fracture.

Conus: Ends at a normal level. Unremarkable cauda equina nerve roots.

L1-L2: Minimal disc bulge. No significant canal or neural foraminal stenosis.

L2-L3: Mild asymmetric right disc bulge with small right posterior annular fissure. Mild narrowing of the right lateral recess without nerve root impingement. No significant canal or neural foraminal stenosis.

L3-L4: Mild facet arthrosis. No significant canal or neural foraminal stenosis.

L4-L5: Postsurgical level. No significant canal or neural foraminal stenosis.

L5-S1: Mild asymmetric left disc bulge and small central protrusion. Mild facet arthrosis. Mild narrowing of the left lateral recess with contact of the descending left S1 nerve root. No significant canal or neural foraminal stenosis.

Bilateral lower paraspinous muscle atrophy.
IMPRESSION: Mild narrowing of the left lateral recess at L5-S1 secondary to an asymmetric left disc bulge. Contact of the descending left S1 nerve root.

No high-grade canal or neural foraminal stenosis.

Postsurgical changes at L4-L5.

## 2020-04-01 IMAGING — CR [HOSPITAL] ORBITS
2 series · 2 of 2 positions shown · non-contrast
Comparison: None

HISTORY: Foreign object
TECHNIQUE: Orbits for foreign body, 2 views

[w waters pa]
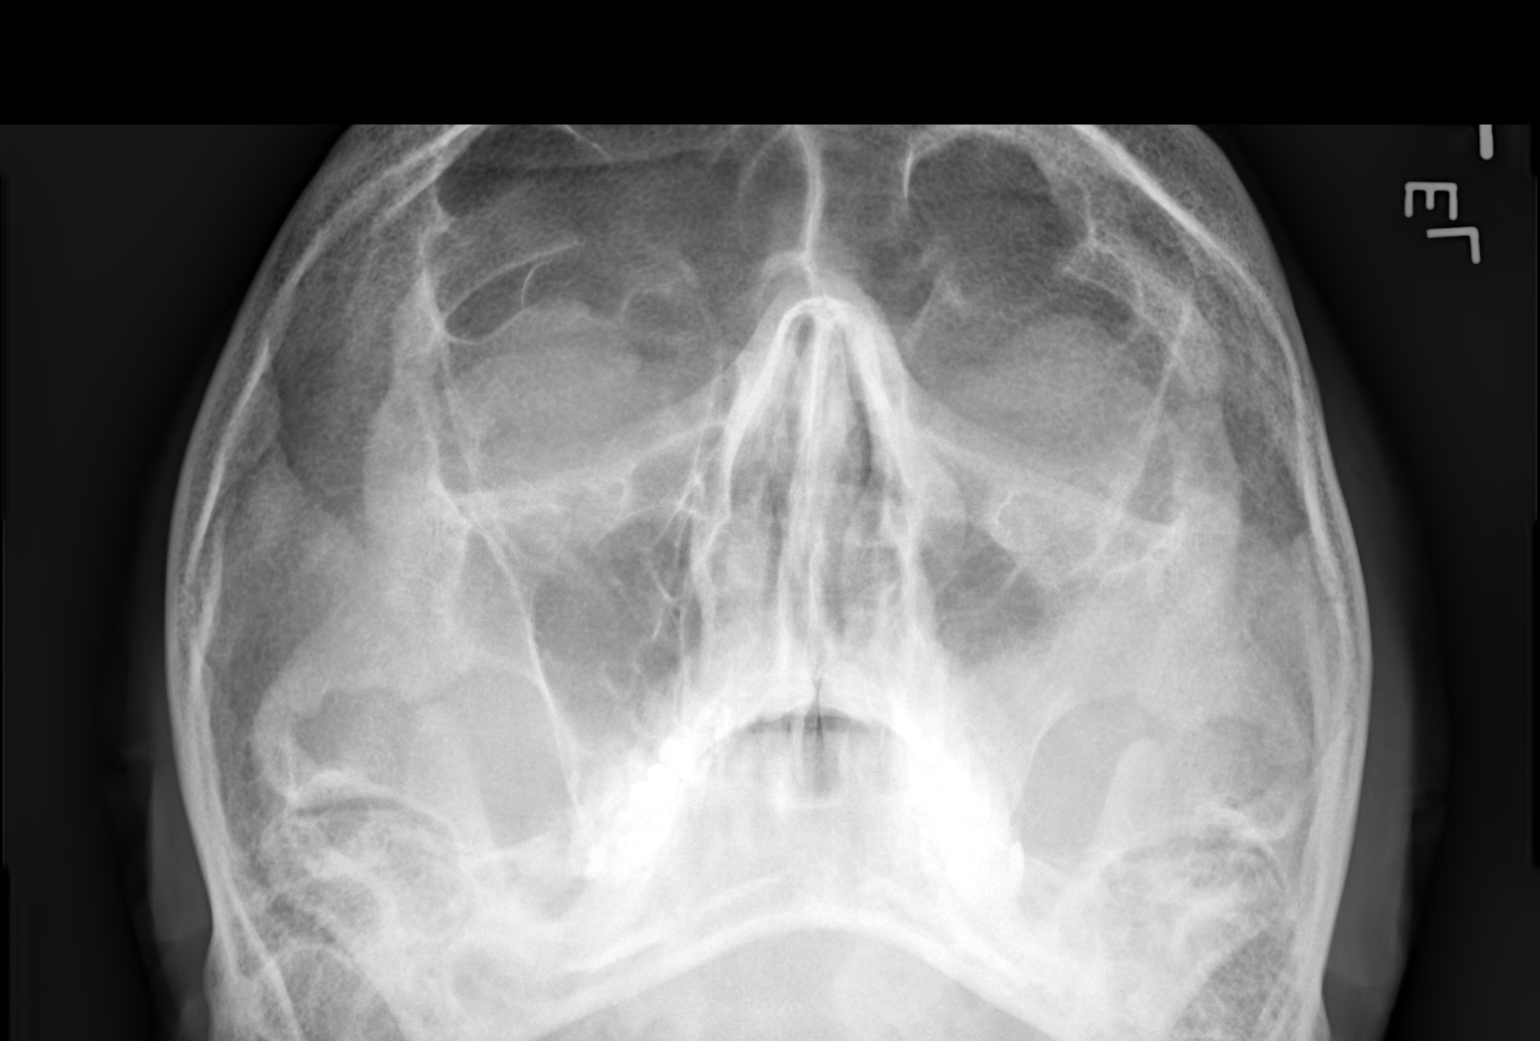

[w orbits lat]
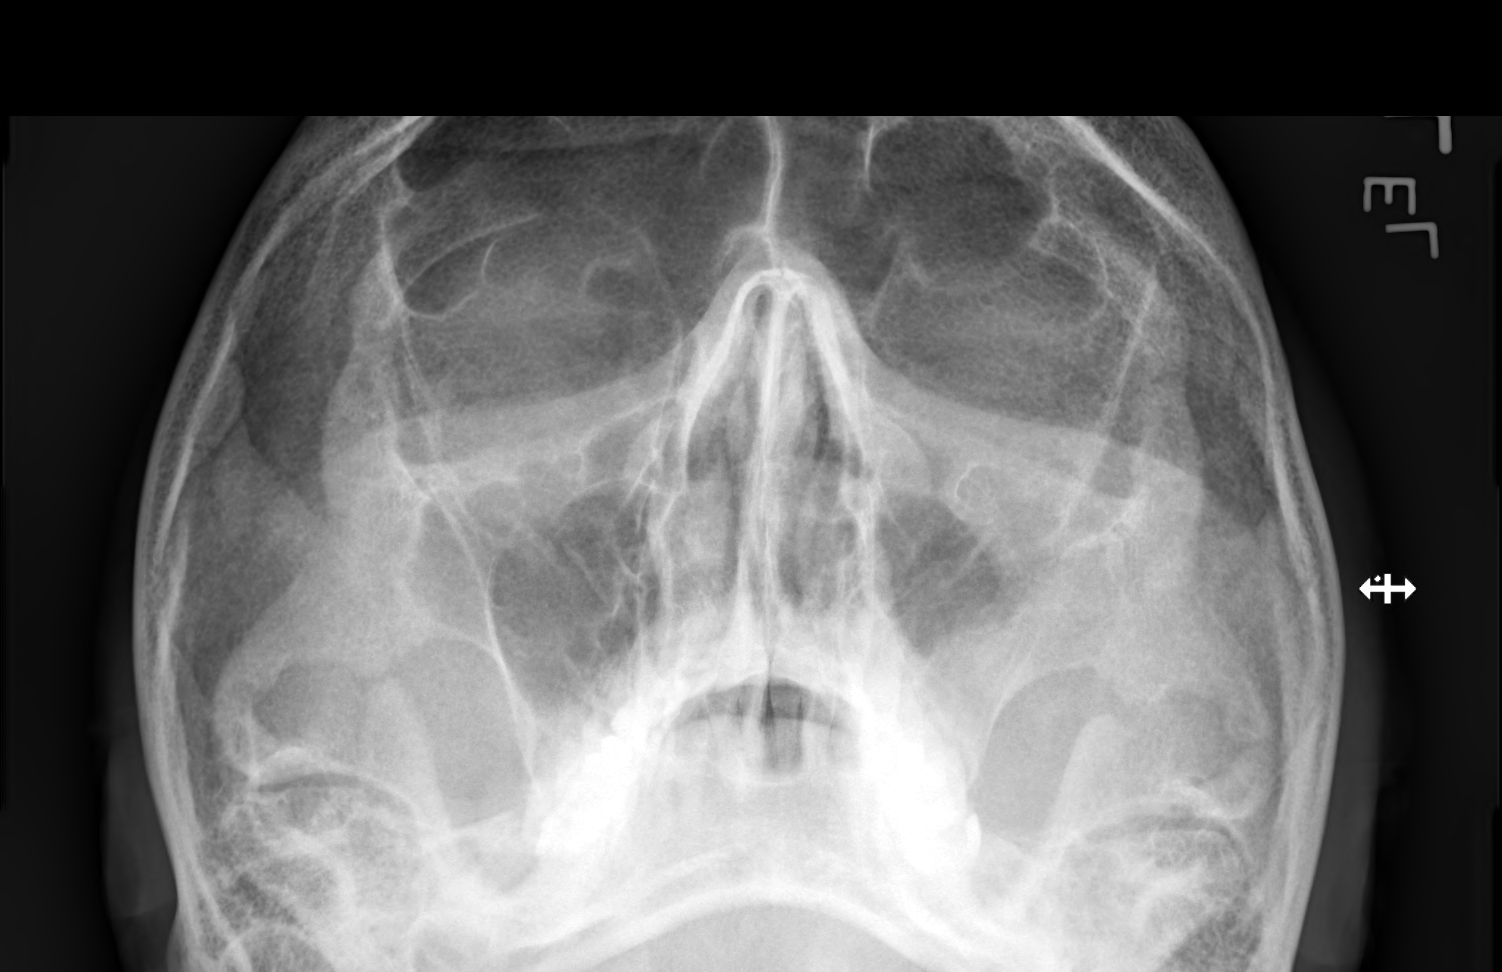

[2 of 2 positions shown; findings below may reference images not displayed]

FINDINGS: 2 views of the orbits demonstrate no metallic foreign bodies.
IMPRESSION: Patient may be safely placed within the MRI unit.

## 2020-05-08 IMAGING — CR KNEE LT 4 VWS MIN
4 series · 4 of 4 positions shown · non-contrast
Comparison: None available.

INDICATION: Left knee pain
TECHNIQUE: 4 views of the left knee are obtained.

[w knee ap left]
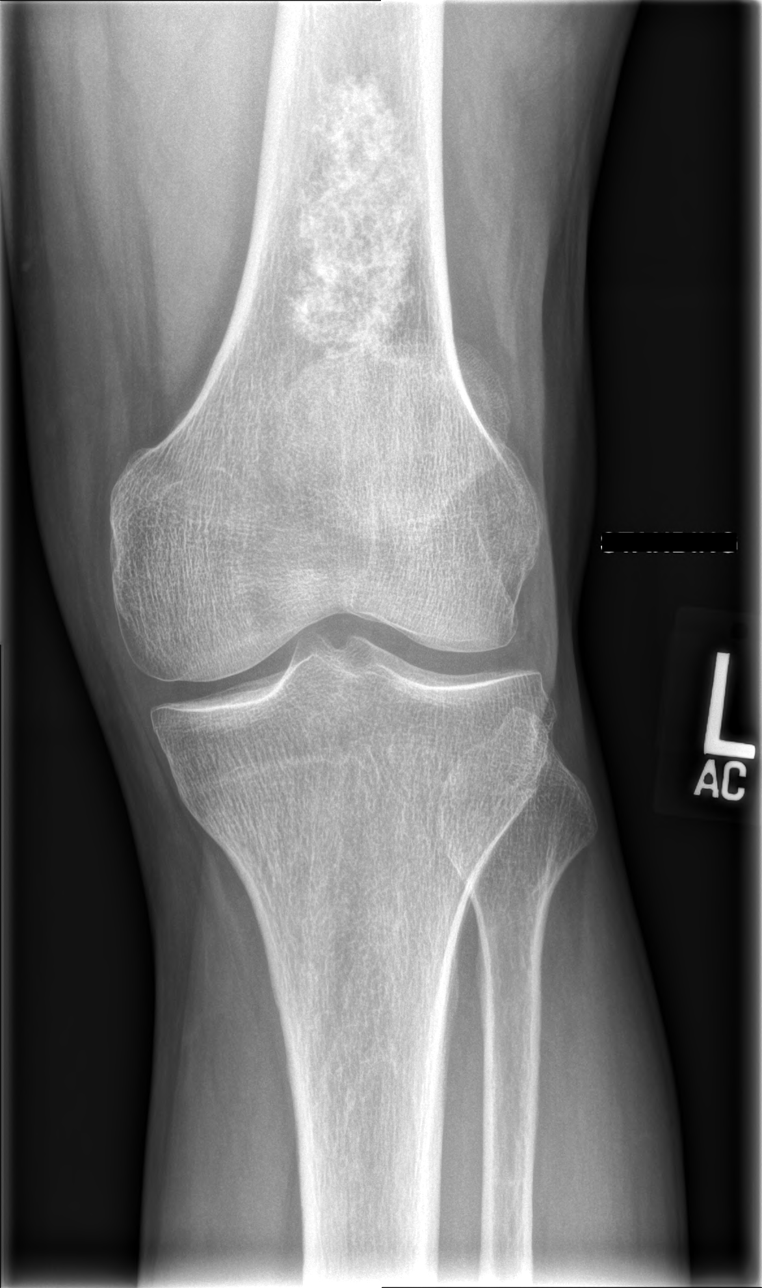

[x knee sunrise left]
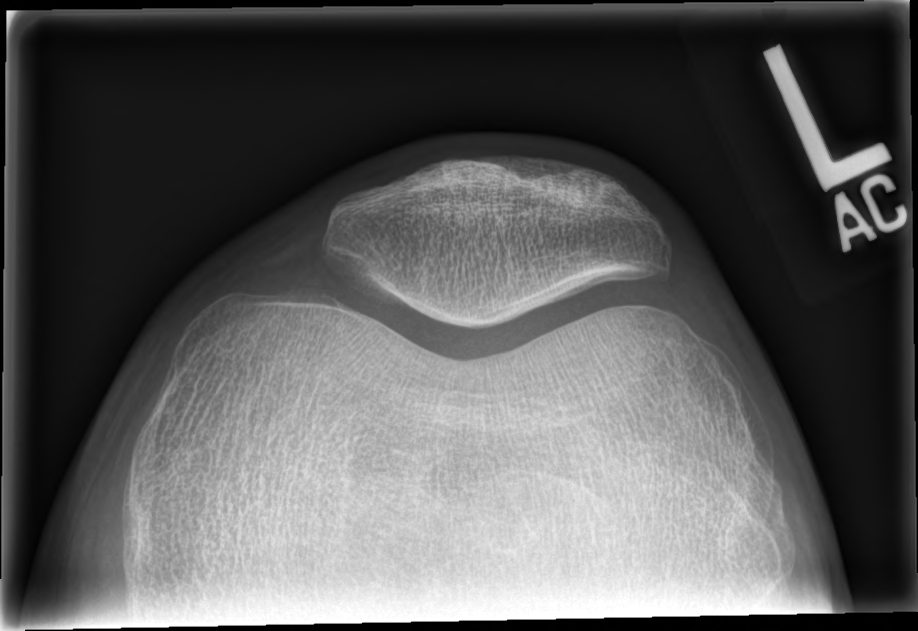

[t knee ap left]
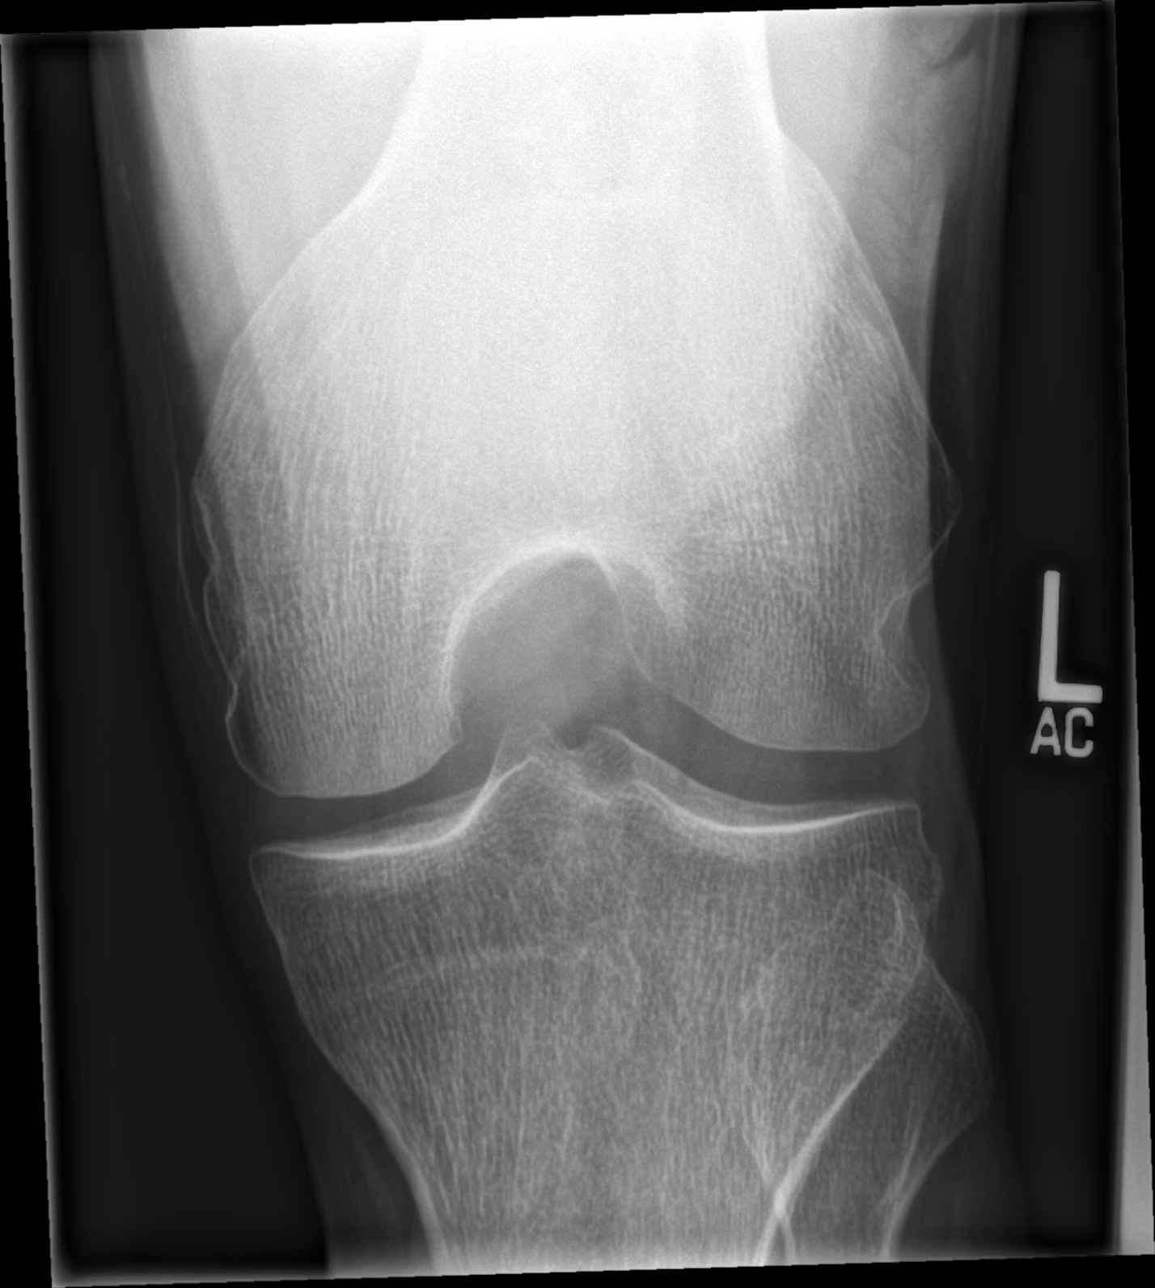

[t knee lat left]
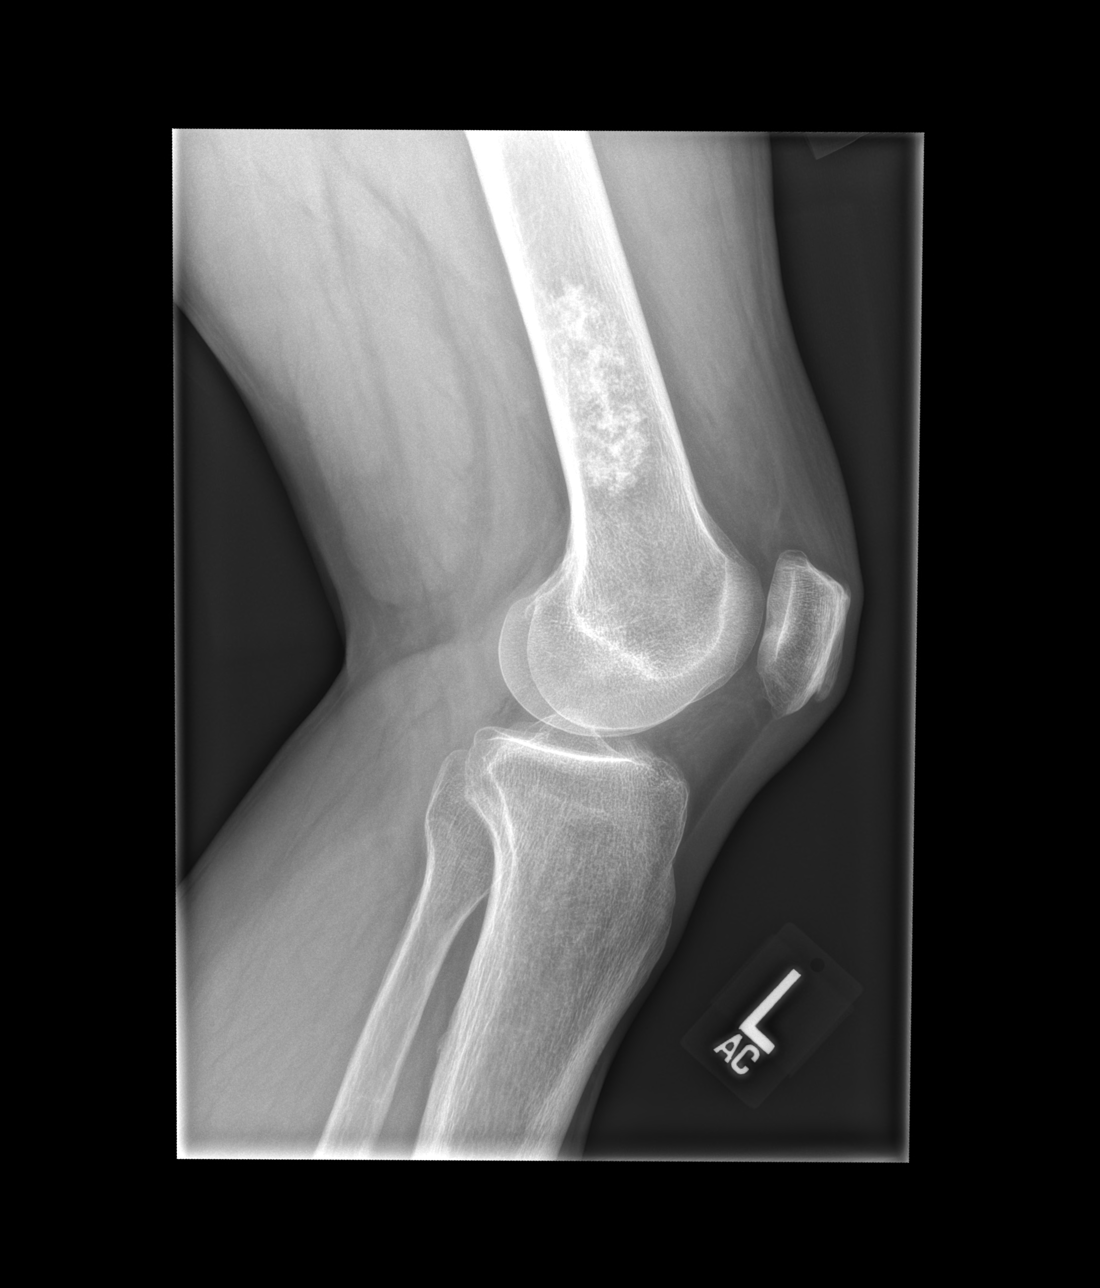

[4 of 4 positions shown; findings below may reference images not displayed]

FINDINGS: No acute fracture is identified. The joint spaces are maintained. No effusion. A 6.7 x 6.4 x 2.5 cm lobulated sclerotic lesion with chondroid mixture is present within the distal femoral metadiaphysis. No endosteal scalloping, pathologic fracture or cortical disruption.
IMPRESSION: 6.7 x 6.4 x 2.5 cm lesion within the distal femoral metadiaphysis, without aggressive features. This may represent a large enchondroma; however, a low-grade chondrosarcoma is a differential consideration (given size). Correlate with pain referable to the this lesion. MRI of the thigh without and with contrast could be performed for further characterization. Recommend continued radiographic follow-up and orthopedic oncology evaluation.

Stat fax

## 2020-08-15 ENCOUNTER — Ambulatory Visit: Admit: 2020-08-15 | Payer: BLUE CROSS/BLUE SHIELD | Attending: Otolaryngology/Facial Plastic Surgery

## 2020-08-15 DIAGNOSIS — R682 Dry mouth, unspecified: Secondary | ICD-10-CM

## 2020-08-15 NOTE — Telephone Encounter (Signed)
Patient is running late due to the traffic delays. Appointment is today at 2:20 pm. Patient is currently in [facility] @ 1:55pm. Traveling from [facility] with road construction on [address].    Please call back at [phone]    Okay to leave a detailed message

## 2020-08-15 NOTE — Progress Notes (Signed)
Chief Complaint   Patient presents with   . Neck Lymphadenopathy     Recheck     HISTORY OF PRESENT ILLNESS     Alan Hughes is a/an 49 y.o. male being seen today for follow-up of Neck Lymphadenopothy.  Since last seen the symptoms have stayed the same. He does have additional symptoms including: fatigue, shortness of breath, night sweats, fainting spells, dizziness, tired after short bursts of energy.  He was treated at last visit with Augmentin.  The patient states he did complete treatment.  The patient noted that treatment has been ineffective.  He admits to getting tired after brief bouts of exercise.  He has gained a few pounds in the past several months.    VISIT HISTORY:  11/13/2019 (Alan Hughes) - Alan Hughes is a/an 49 y.o. male here s/p excisional biopsy of left neck enlarged lymph node performed by Dr. Allayne Butcher on 11/01/19  The surgical pathology demonstrates some reactive, nonspecific paracortical hyperplasia. No malignancy seen. He used/has been using pain medication for 0 days. He does not need a refill of pain medication. Patient reports symptoms today of fatigue.  Patient is here today for review of pathology and determine if further work-up or treatment is required.  10/26/2019 (Alan Hughes) - Alan Hughes is a 49 y.o. male who has been referred by Rolin Barry, [facility] for evaluation of a mass that was discovered approximately 6 months ago by his wife. Symptoms are described as swelling. His symptoms are located on the left neck.   The symptoms affect the patient in the following ways: uncomfortable and concerning. He denies pain but reports uncomfortable feeling like a cramped muscle. Symptoms are constant. The symptoms are aggravated by movement . The symptoms are alleviated with decreasing movement and ROM.  He reports that he has also had unexplained weight loss and the neck swelling has been enlarging. He states that he has lost 22 lbs in the last 1 month and up to 10 lbs over 5 months previous to that.  Patient specifically denies pain, redness or discoloration of the skin, tenderness with eating, difficulty opening mouth, difficulty swallowing. Patient states he was talking on the phone and walking at work yesterday when he had an episode of syncope that did cause him to collapse and he lost consciousness. He started to twitch as if he was having a seizure. He was transported to ED for evaluation and that report is available in the chart. He also reports intermittent dizziness that causes him to feel a lightheaded and numbness feeling in the head and makes him shaky. He states that over the last several months he has been having severe night sweats to the point that he is drenched. He reports chills, weight loss and increased of lethargy. Small red dots have arrived on face and chest.   Previous diagnostic studies for the mass include: CT scan and  are available for chart review. He has not been evaluated by another specialist for enlarged lymph nodes.     Tobacco History:  Ready to quit: No  Counseling given: Yes  Comment: Current tobacco chewer    RESULTS     Thyroid Labs:  Lab Results   Component Value Date    TSH 0.98 10/30/2019     Lab Results   Component Value Date    FREET4 1.0 10/30/2019     MEDICAL HX     Past Medical History:   Diagnosis Date   . Chronic pain     back   .  Hypertension    . Lymphadenopathy      Past Surgical History:   Procedure Laterality Date   . BACK SURGERY      X4, lumbar   . COLONOSCOPY  2011   . ESOPHAGOGASTRODUODENOSCOPY  2011   . FOOT SURGERY Left     2nd and third toe partial removal d/t prior fx   . KNEE ARTHROSCOPY Right    . PROCEDURE Left 11/01/2019    Procedure: BIOPSY NECK SOFT TISSUE;  Surgeon: Lorina Rabon, DO;  Location: [facility];  Service: Ear Nose and Throat;  Laterality: Left;     Family History   Problem Relation Age of Onset   . Alcohol abuse Mother    . Diabetes Mother    . Liver disease Mother    . Coronary artery disease Father    . Stroke Father    .  Heart attack Father    . No Known Health Problems Son      Allergies   Allergen Reactions   . No Known Drug Allergies No known drug reaction     Outpatient Medications Marked as Taking for the 08/15/20 encounter (Office Visit) with Lorina Rabon, DO   Medication Sig Dispense Refill   . albuterol sulfate HFA aerosol 90 mcg/actuation Inhale 2 puffs into the lungs every 4 hours as needed for Wheezing.     Marland Kitchen lisinopriL (ZESTRIL) 40 MG tablet Take 20 mg by mouth daily.      . methadone (DOLOPHINE) 10 MG tablet Take by mouth 2 times daily.       Social History     Socioeconomic History   . Marital status: Married     Spouse name: Not on file   . Number of children: Not on file   . Years of education: Not on file   . Highest education level: Not on file   Tobacco Use   . Smoking status: Never Smoker   . Smokeless tobacco: Current User     Types: Chew   . Tobacco comment: Current tobacco chewer   Vaping Use   . Vaping Use: Never used   Substance and Sexual Activity   . Alcohol use: Never   . Drug use: Never   . Sexual activity: Not on file   Other Topics Concern   . Not on file     Social History     Tobacco Use   Smoking Status Never Smoker   Smokeless Tobacco Current User   . Types: Chew   Tobacco Comment    Current tobacco chewer     REVIEW OF SYSTEMS     Review of Systems   Constitutional: Positive for chills, diaphoresis, fever and malaise/fatigue.   HENT: Negative.    Eyes: Negative.    Respiratory: Positive for shortness of breath.    Cardiovascular: Positive for chest pain and claudication.   Gastrointestinal: Negative.    Genitourinary: Negative.    Musculoskeletal: Positive for back pain and joint pain.   Skin: Positive for itching.   Neurological: Positive for dizziness, focal weakness and weakness.   Endo/Heme/Allergies: Positive for polydipsia.   Psychiatric/Behavioral: Positive for depression and memory loss.     PHYSICAL EXAM     Body mass index is 27.12 kg/m?Marland Kitchen  Vitals:    08/15/20 1438   Temp: 36.8 ?C  (98.2 ?F)   TempSrc: Temporal   Weight: 200 lb (90.7 kg)   Height: 6' (1.829 m)       PHYSICAL EXAMINATION:  CONSTITUTIONAL:  General Appearance: well nourished, well-developed, alert, oriented, in no acute distress, cooperative with examination  Communication Ability / Voice Quality : communication ability normal, voice quality normal, English     FACE:  Inspection : normal appearance, no lesions present, no evidence of trauma, jaw position normal   Palpation : frontoethmoidal sinus nontender, maxillary sinuses nontender to palpation, no masses present   Facial Strength : facial motion symmetric, normal eye closure strength bilaterally   Parotid Glands : no tenderness on palpation, no swelling present, no masses present     EARS:  Hearing : hearing to conversational voice intact   External Ears : auricles anatomically normal bilaterally, no auricle lesions present, no auricle tenderness to palpation present   External Auditory Canals: ear canals with no significant cerumen, external auditory canals have normal appearance, no external auditory canal lesions present  Otoscopic/Microscopic Exam :   Right ear: tympanic membranes appear normal, no tympanic membrane lesions present, no tympanic membrane perforations present, no fluid present behind tympanic membranes  Left ear: tympanic membranes appear normal, no tympanic membrane lesions present, no tympanic membrane perforations present, no fluid present behind tympanic membranes  Vestibular System : Normal     NOSE / NASOPHARYNX:  External Nose : appearance normal, no tenderness on palpation, no significant nasal discharge present, no lesions, no evidence of trauma, nostrils patent without discharge   Intranasal Exam : nasal mucosa pink, moist and within normal limits, vestibule within normal limits, inferior turbinates appear normal, middle turbinates appear normal, nasal septum relatively midline, nasal discharge is clear and minimal, no polyposis seen      Nasopharynx: Nasopharyngeal mucosa without edema, ulcerations, or masses. Tori tubarius are bilaterally symmetrical without any lesions or masses impeding pharyngotympanic tube opening.     ORAL CAVITY / OROPHARYNX:  Lips : upper and lower lips pink, moist, and normal appearing.   Teeth : dentition within normal limits for age   Gums : gingivae healthy   Oral Mucosa : oral mucosa pink and moist with no lesions present   Floor of Mouth : floor of mouth within normal limits, salivary ducts appear patent   Tongue : tongue moist, midline, with symmetrical movements with no lesions seen.   Palate : soft and hard palates within normal limits   Oropharynx : appearance within normal limits, tonsils normal in appearance, peritonsillar regions within normal limits     HYPOPHARYNX / LARYNX:   Flexible Endoscopic Examination:   Hypopharynx: Normal general appearance, no lesions present   Larynx: Normal general appearance, epiglottis within normal limits, arytenoid cartilage within normal limits, vestibular folds normal, vocal cords appear normal, normal vocal cord position at rest and with symmetrical movements    NECK:  Inspection and Palpation : appearance normal, no masses or tenderness on palpation; trachea midline and freely movable   Thyroid : size of gland normal, no tenderness, nodules or mass present on palpation, position midline   Submandibular Glands : normal size, nontender to palpation   Lymph Nodes : no lymphadenopathy present     SKIN AND SUBCUTANEOUS TISSUES:  Skin : normal coloration and skin turgor for age, no rashes     CARDIOVASCULAR:  Carotid: normal pulses; no bruits    CHEST / RESPIRATORY:  Respiratory Effort : Symmetrical diaphragmatic excursion. No increased WOB.  Auscultation of Lungs :  NORMAL; CTAB s w, r, r.     CARDIAC:  Heart: DISTANT HEART SOUNDS, OTHERWISE RRR s m, r, g. Nl S1 and S2. No  S3 or S4.  Pulses: Pulses 2+ and symmetrical at radial, brachial, and carotid. No carotid bruits noted on  exam.     NEUROLOGICAL / PSYCHIATRIC:  Orientation : oriented to time, place and person   Mood and Affect : mood normal, affect appropriate   Cranial Nerves : CN II-XII appear intact   Gait : gait normal    IN OFFICE PROCEDURES     NONE    ASSESSMENT       ICD-10-CM    1. Dry mouth, unspecified  R68.2 ANA Screen -Routine   2. Lethargy  R53.83    3. Chronic night sweats  R61    4. Tobacco use  Z72.0       PLAN     1.  DRY EYES AND DRY MOUTH: order ANA to rule out Sjogren's  2.  We will order EKG for intiating evaluation of heart as it sounds like this may be an issue.     Patient instructions were provided and all questions answered.    No follow-ups on file.    I, Haille Pardi Allayne Butcher, DO, personally performed the services described in this documentation,  and it is both accurate and complete.    *This dictation was produced using voice recognition software. Despite concurrent proofreading, please note transcription errors may be present and that these errors may not truly reflect my intent. If you have any questions concerning the content, please call our office at [phone].*

## 2020-10-09 NOTE — Telephone Encounter (Signed)
Patients wife Alan Hughes called in requesting to schedule Alan Hughes an appointment as Alan Hughes felt like he had a lot of pressure in his right ear and enough to where he felt like his eye was going to pop. I currently have Alan Hughes scheduled for December.21     Please call back at [phone] or Alan Hughes @ [phone]    Okay to leave a detailed message.

## 2020-10-16 NOTE — Telephone Encounter (Signed)
Patient is scheduled for sooner appt tomorrow @ 11:20 am.

## 2020-10-17 ENCOUNTER — Ambulatory Visit: Admit: 2020-10-17 | Payer: BLUE CROSS/BLUE SHIELD | Attending: Otolaryngology/Facial Plastic Surgery

## 2020-10-17 DIAGNOSIS — H938X3 Other specified disorders of ear, bilateral: Secondary | ICD-10-CM

## 2020-10-17 MED ORDER — ciprofloxacin-dexamethasone (CIPRODEX) otic suspension
0.3-0.1 | OTIC | 0 refills | 18.00000 days | Status: AC
Start: 2020-10-17 — End: 2020-10-27

## 2020-10-17 NOTE — Progress Notes (Signed)
Chief Complaint   Patient presents with   . Ear Pressue     Est Patient, New Problem     HISTORY OF PRESENT ILLNESS     Alan Hughes is a 49 y.o. male who has been self referred for evaluation of ear pressure involving both ears that began approximately 1 month ago.     Symptoms are described as: moderate, that developed suddenly and are intermittent.     The symptoms affect the patient in the following ways: b/l ear pressure and swelling of the inner ear, balance issues when symptoms are present and neck/jaw tightness.  He specifically denies ear pain. Symptoms are improved with nothing and occur intermittently. He also admits he continues to have extreme fatigue and drenching night sweats.    The symptoms have been treated with OTC ear drops and massage without relief.  Previous diagnostic studies have not been performed recently.  Previous ear surgeries include: no surgeries.  He has not been evaluated by another specialist.     Tobacco History:  Ready to quit: Yes  Counseling given: Yes  Comment: Current tobacco chewer, cutting back.    RESULTS     Thyroid Labs:  Lab Results   Component Value Date    TSH 0.98 10/30/2019     Lab Results   Component Value Date    FREET4 1.0 10/30/2019       MEDICAL HX     Past Medical History:   Diagnosis Date   . Chronic pain     back   . Hypertension    . Lymphadenopathy      Past Surgical History:   Procedure Laterality Date   . BACK SURGERY      X4, lumbar   . COLONOSCOPY  2011   . ESOPHAGOGASTRODUODENOSCOPY  2011   . FOOT SURGERY Left     2nd and third toe partial removal d/t prior fx   . KNEE ARTHROSCOPY Right    . PROCEDURE Left 11/01/2019    Procedure: BIOPSY NECK SOFT TISSUE;  Surgeon: Lorina Rabon, DO;  Location: [facility];  Service: Ear Nose and Throat;  Laterality: Left;     Family History   Problem Relation Age of Onset   . Alcohol abuse Mother    . Diabetes Mother    . Liver disease Mother    . Coronary artery disease Father    . Stroke Father    . Heart attack  Father    . No Known Health Problems Son      Allergies   Allergen Reactions   . No Known Drug Allergies No known drug reaction     Outpatient Medications Marked as Taking for the 10/17/20 encounter (Office Visit) with Lorina Rabon, DO   Medication Sig Dispense Refill   . albuterol sulfate HFA aerosol 90 mcg/actuation Inhale 2 puffs into the lungs every 4 hours as needed for Wheezing.     Marland Kitchen lisinopriL (ZESTRIL) 40 MG tablet Take 20 mg by mouth daily.      . methadone (DOLOPHINE) 10 MG tablet Take by mouth 2 times daily.       Social History     Socioeconomic History   . Marital status: Married     Spouse name: Not on file   . Number of children: Not on file   . Years of education: Not on file   . Highest education level: Not on file   Tobacco Use   . Smoking status: Never Smoker   .  Smokeless tobacco: Current User     Types: Chew   . Tobacco comment: Current tobacco chewer, cutting back.   Vaping Use   . Vaping Use: Never used   Substance and Sexual Activity   . Alcohol use: Never   . Drug use: Never   . Sexual activity: Not on file   Other Topics Concern   . Not on file     Social History     Tobacco Use   Smoking Status Never Smoker   Smokeless Tobacco Current User   . Types: Chew   Tobacco Comment    Current tobacco chewer, cutting back.     REVIEW OF SYSTEMS     Review of Systems   Constitutional: Negative.    HENT: Negative.    Eyes: Negative.    Respiratory: Negative.    Cardiovascular: Negative.    Skin: Negative.    All other systems reviewed and are negative.    12 point review of systems negative except as noted above.    PHYSICAL EXAM     Body mass index is 27.12 kg/m?Marland Kitchen  Vitals:    10/17/20 1137   Temp: 36.3 ?C (97.3 ?F)   TempSrc: Temporal   Weight: 200 lb (90.7 kg)   Height: 6' (1.829 m)       PHYSICAL EXAMINATION:    CONSTITUTIONAL:  General Appearance: well nourished, well-developed, alert, oriented, in no acute distress, cooperative with examination  Communication Ability / Voice Quality :  communication ability normal, voice quality normal, English     FACE:  Inspection : normal appearance, no lesions present, no evidence of trauma, jaw position normal   Palpation : frontoethmoidal sinus nontender, maxillary sinuses nontender to palpation, no masses present   Facial Strength : facial motion symmetric, normal eye closure strength bilaterally   Parotid Glands : no tenderness on palpation, no swelling present, no masses present     EARS:  Hearing : hearing to conversational voice intact   External Ears : auricles anatomically normal bilaterally, no auricle lesions present, no auricle tenderness to palpation present   External Auditory Canals: ear canals with no significant cerumen, external auditory canals have normal appearance, no external auditory canal lesions present  Otoscopic/Microscopic Exam :   Right ear: tympanic membranes appear normal, no tympanic membrane lesions present, no tympanic membrane perforations present, no fluid present behind tympanic membranes  Left ear: tympanic membranes appear normal, no tympanic membrane lesions present, no tympanic membrane perforations present, no fluid present behind tympanic membranes  Vestibular System : Normal     NOSE / NASOPHARYNX:  External Nose : appearance normal, no tenderness on palpation, no significant nasal discharge present, no lesions, no evidence of trauma, nostrils patent without discharge   Intranasal Exam : nasal mucosa pink, moist and within normal limits, vestibule within normal limits, inferior turbinates appear normal, middle turbinates appear normal, nasal septum relatively midline, nasal discharge is clear and minimal, no polyposis seen     ORAL CAVITY / OROPHARYNX:  Lips : upper and lower lips pink, moist, and normal appearing.   Teeth : dentition within normal limits for age   Gums : gingivae healthy   Oral Mucosa : oral mucosa pink and moist with no lesions present   Floor of Mouth : floor of mouth within normal limits,  salivary ducts appear patent   Tongue : tongue moist, midline, with symmetrical movements with no lesions seen.   Palate : soft and hard palates within normal limits  Oropharynx : appearance within normal limits, tonsils normal in appearance, peritonsillar regions within normal limits     NECK:  Inspection and Palpation : appearance normal, no masses or tenderness on palpation; trachea midline and freely movable   Thyroid : size of gland normal, no tenderness, nodules or mass present on palpation, position midline   Submandibular Glands : normal size, nontender to palpation   Lymph Nodes : no lymphadenopathy present     NEUROLOGICAL / PSYCHIATRIC:  Orientation : oriented to time, place and person   Mood and Affect : mood normal, affect appropriate   Cranial Nerves : CN II-XII appear intact   Gait : gait normal     IN OFFICE PROCEDURES     NONE    ASSESSMENT       ICD-10-CM    1. Ear pressure, bilateral  H93.8X3 ciprofloxacin-dexamethasone (CIPRODEX) otic suspension   2. Fatigue, unspecified type  R53.83 Comprehensive Metabolic Panel -Routine   3. Sleep apnea in adult  G47.30    4. Current tobacco use  Z72.0    5. Other chronic sinusitis  J32.8 CT sinus without contrast stealth      PLAN     1. Will order steroid drops for symptom re-occurrence    2. Sinus CT ordered for evaluation of possible chronic sinusitis causing fatigue symptoms.     Patient instructions were provided and all questions answered.    Return if symptoms worsen or fail to improve.    I, Goldye Tourangeau Allayne Butcher, DO, personally performed the services described in this documentation, as scribed by Babette Relic, CCMA in my presence, and it is both accurate and complete.    *This dictation was produced using voice recognition software. Despite concurrent proofreading, please note transcription errors may be present and that these errors may not truly reflect my intent. If you have any questions concerning the content, please call our office at [phone].*

## 2020-11-05 ENCOUNTER — Encounter: Payer: BLUE CROSS/BLUE SHIELD | Attending: Otolaryngology/Facial Plastic Surgery

## 2021-09-30 IMAGING — MR MRI CSPINE WO CONTRAST
5 series · 41 of 48 positions shown · non-contrast
Comparison: None

HISTORY: Cervical radiculopathy, pain between shoulder blades
TECHNIQUE: Routine multiplanar MRI of the cervical spine was performed without IV contrast.

[Series 1: bSSFP · axial · 6.0mm · 1.17mm/px · z∈[-66,+147]mm · 12 of 22 slices shown]
[im 1/22]
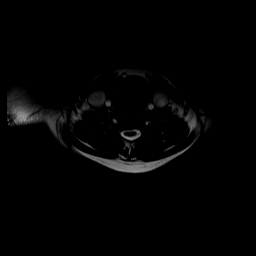
[im 2/22]
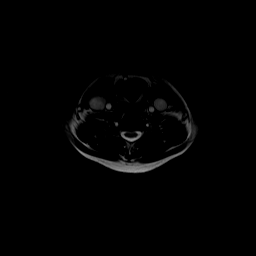
[im 4/22]
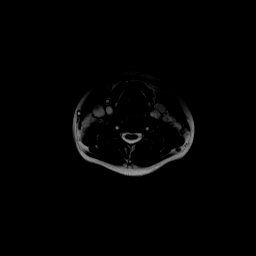
[im 6/22]
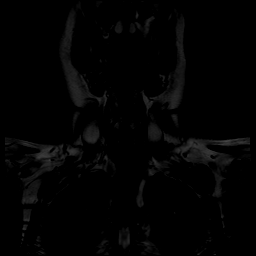
[im 8/22]
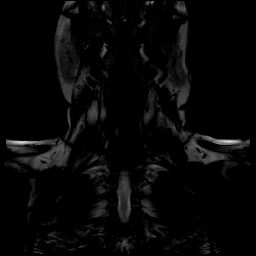
[im 10/22]
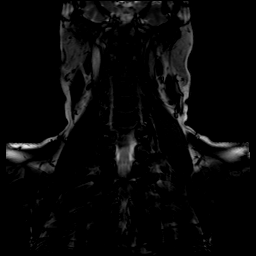
[im 12/22]
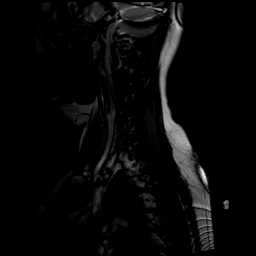
[im 14/22]
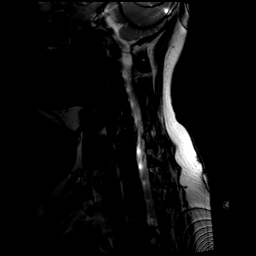
[im 16/22]
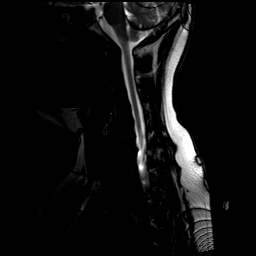
[im 18/22]
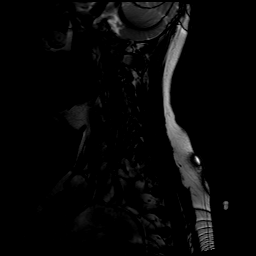
[im 20/22]
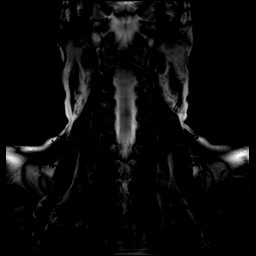
[im 22/22]
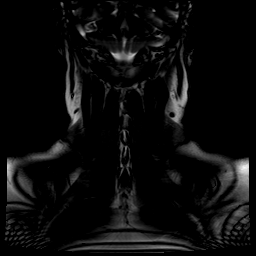

[Series 2: t2_sag · sagittal · 3.0mm · 0.57mm/px · 7 of 15 slices shown]
[im 1/15]
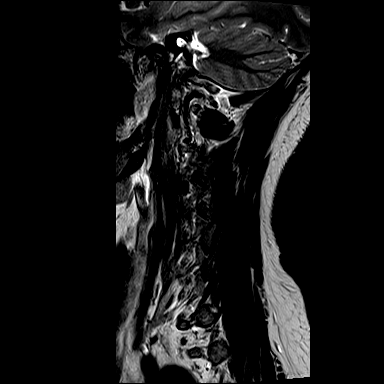
[im 3/15]
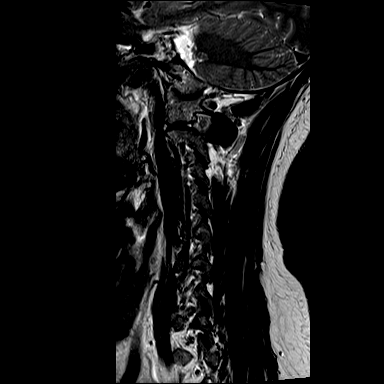
[im 5/15]
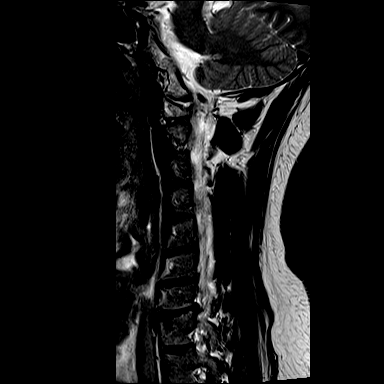
[im 8/15]
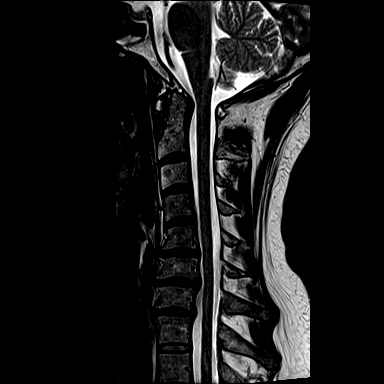
[im 10/15]
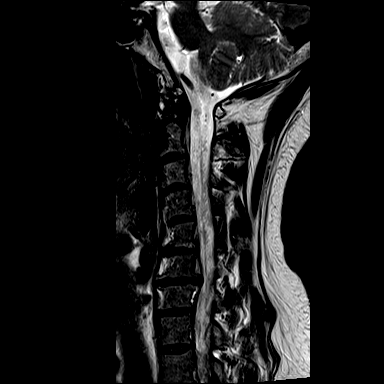
[im 12/15]
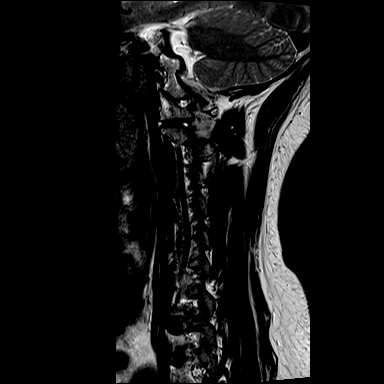
[im 15/15]
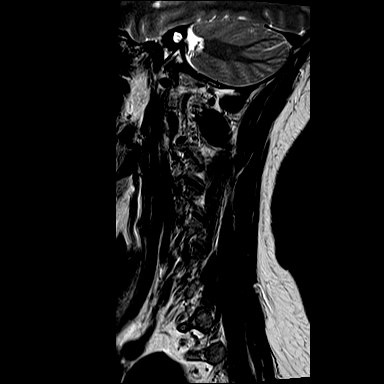

[Series 3: t1_sag · sagittal · 3.0mm · 0.69mm/px · 7 of 15 slices shown]
[im 1/15]
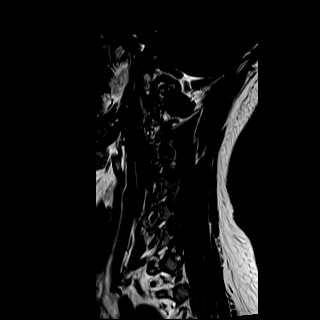
[im 3/15]
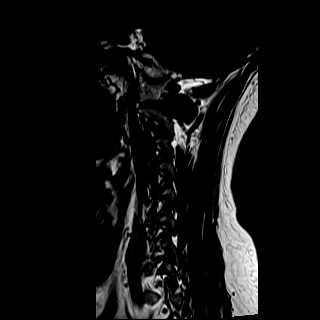
[im 5/15]
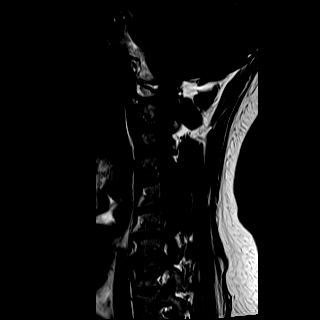
[im 8/15]
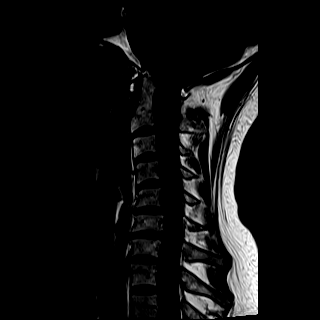
[im 10/15]
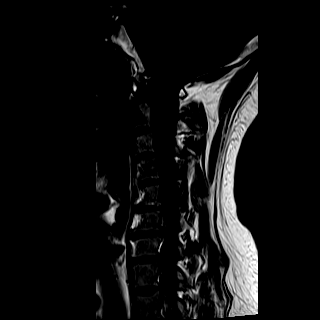
[im 12/15]
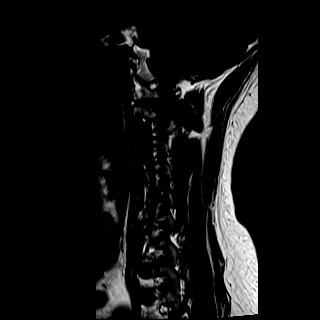
[im 15/15]
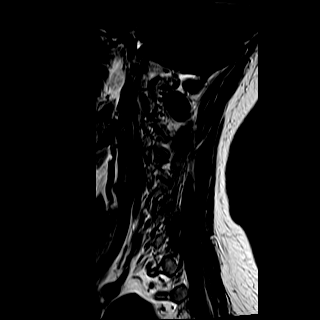

[Series 4: ir_sag · sagittal · 3.0mm · 0.86mm/px · 6 of 13 slices shown]
[im 1/13]
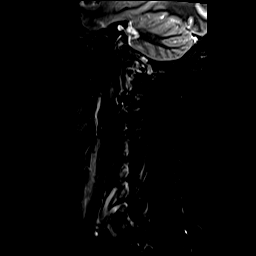
[im 3/13]
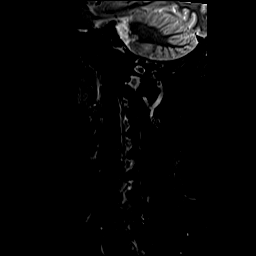
[im 5/13]
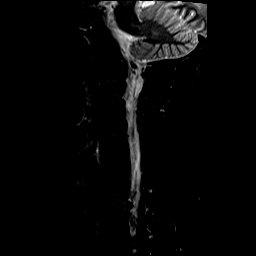
[im 8/13]
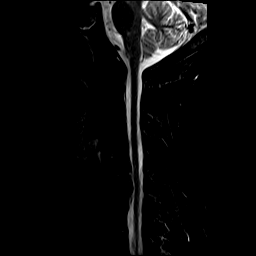
[im 10/13]
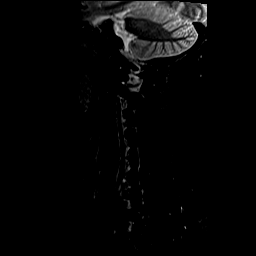
[im 13/13]
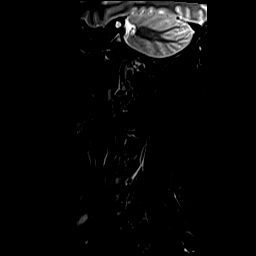

[Series 5: t2_medic_axial · axial · 3.0mm · 0.30mm/px · z∈[-56,+64]mm · 9 of 32 slices shown]
[im 1/32]
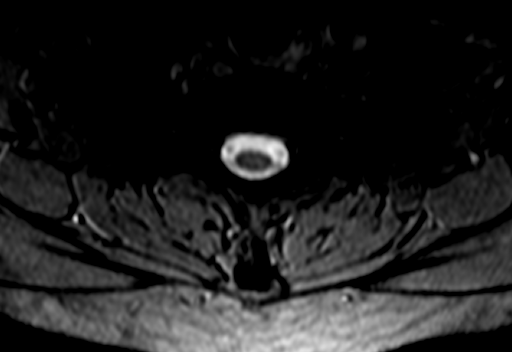
[im 3/32]
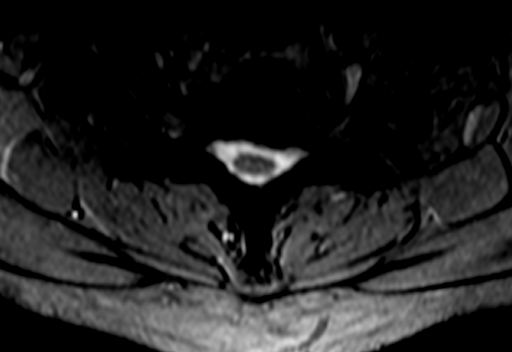
[im 7/32]
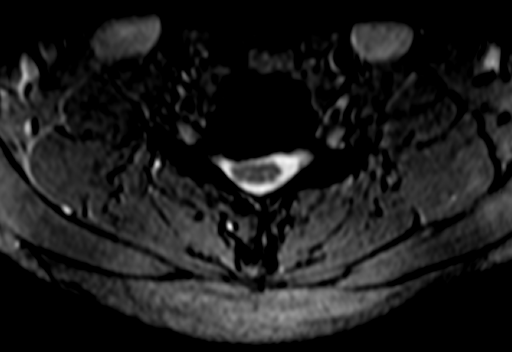
[im 11/32]
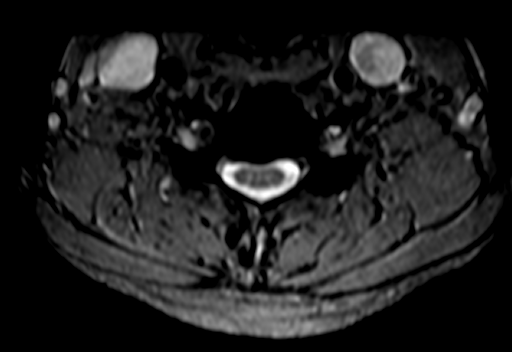
[im 15/32]
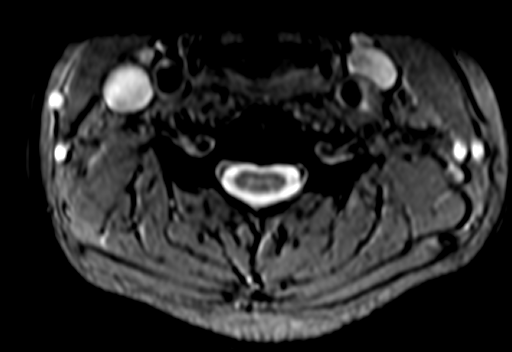
[im 19/32]
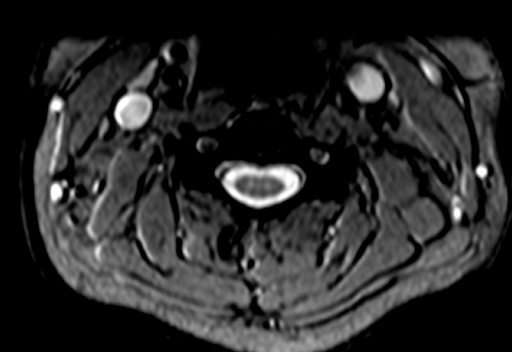
[im 23/32]
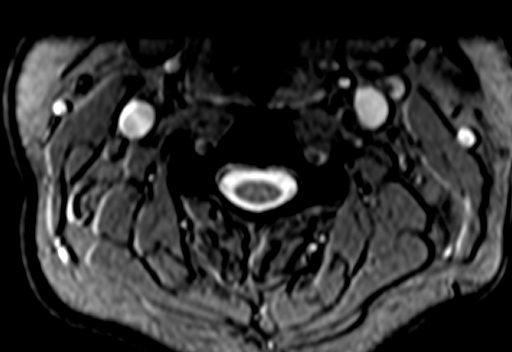
[im 27/32]
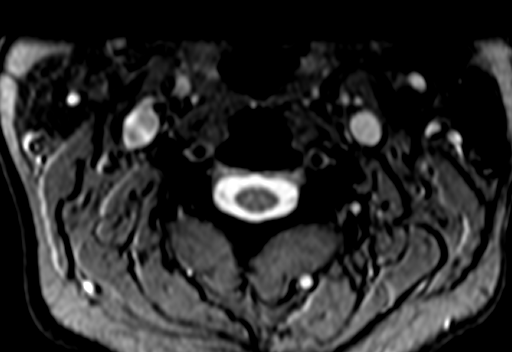
[im 32/32]
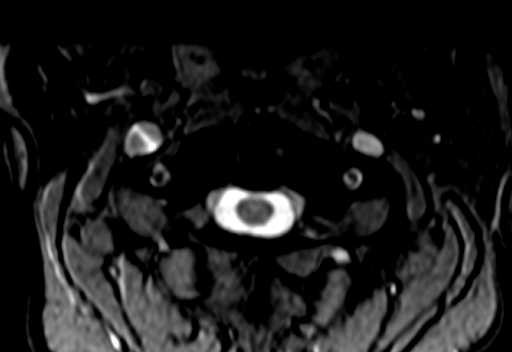

[41 of 48 positions shown; findings below may reference images not displayed]

FINDINGS: Mild reversal normal cervical lordosis. No significant anterolisthesis or retrolisthesis. No fracture or destructive lesion. There is multilevel degenerative disc desiccation, loss of disc space height and concentric disc osteophyte complex. No abnormal signal present in an intradural or paraspinous position. Visualized portions of the brainstem and spinal cord maintain normal signal.

C2-3: No disc bulge or herniation. No canal or foraminal stenosis.

C3-4: No disc bulge or herniation. No canal or foraminal stenosis.

C4-5: No disc bulge or herniation. Mild right uncovertebral osteophytosis. Borderline right foraminal stenosis. No canal stenosis.

C5-6: Mild disc osteophyte. Mild facet arthropathy and uncovertebral osteophytosis, more advanced on the right. No canal stenosis. Moderate right foraminal stenosis. Borderline left foraminal stenosis.

C6-7: Disc osteophyte more pronounced to the right of midline. Mild facet arthropathy. Uncovertebral osteophytosis, more advanced on the right. Mild canal stenosis. Moderate to severe right foraminal stenosis. Mild left foraminal stenosis.

C7-T1: Disc osteophyte more pronounced to the left of midline. Mild facet arthropathy. Left uncovertebral osteophytosis. Mild to moderate left foraminal stenosis. Mild right foraminal stenosis. Borderline canal stenosis.
IMPRESSION: Multilevel degenerative disc disease, facet arthropathy and uncovertebral osteophytosis producing canal and foraminal stenoses as discussed above. Most notably, mild canal stenosis and moderate to severe right foraminal stenosis at C6-7. No fractures. No destructive lesions. Reversal of normal cervical lordosis. Cord maintains normal signal at all levels.

## 2021-09-30 IMAGING — MR MRI LSPINE WO CONTRAST
5 of 6 series · 35 of 48 positions shown · non-contrast
Comparison: MRI lumbar spine 04/01/2020

HISTORY: Radiculopathy lumbar
TECHNIQUE: Routine multiplanar MRI of the lumbar spine was performed without IV contrast.

[Series 1: bSSFP · axial · 8.0mm · 1.37mm/px · z∈[-61,+175]mm · 9 of 25 slices shown]
[im 1/25]
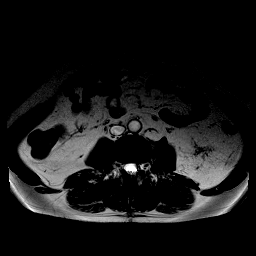
[im 4/25]
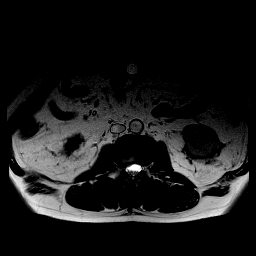
[im 7/25]
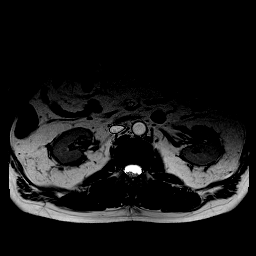
[im 10/25]
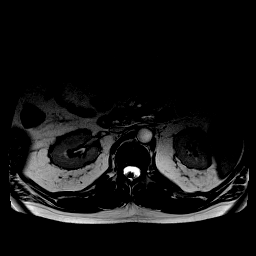
[im 13/25]
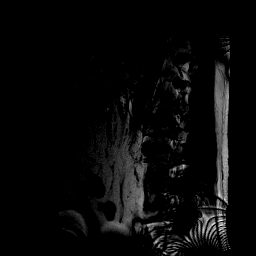
[im 16/25]
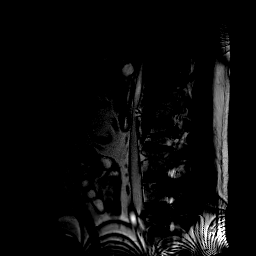
[im 19/25]
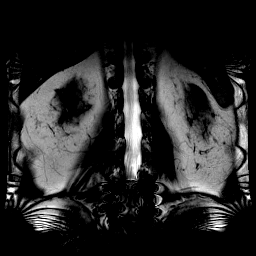
[im 22/25]
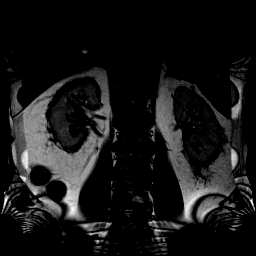
[im 25/25]
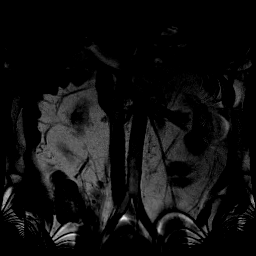

[Series 2: t2_sag_hbw · sagittal · 4.0mm · 0.47mm/px · 6 of 15 slices shown]
[im 1/15]
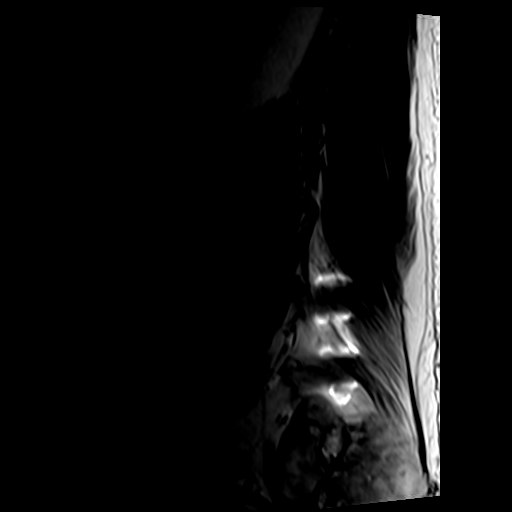
[im 3/15]
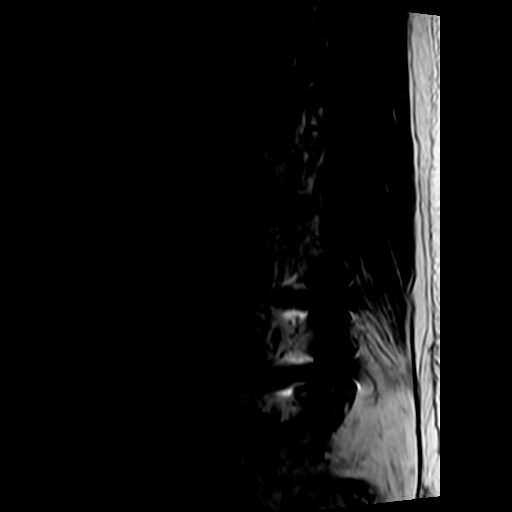
[im 6/15]
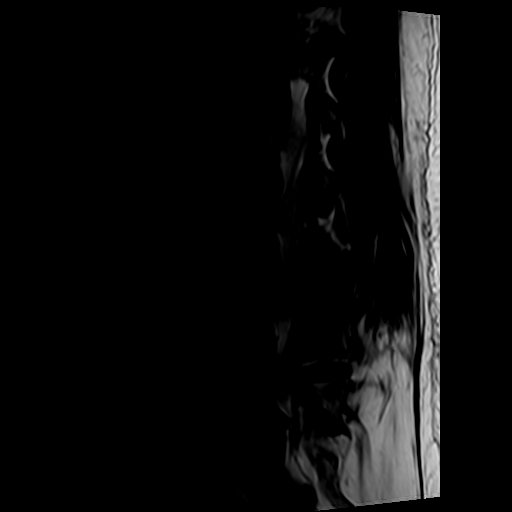
[im 9/15]
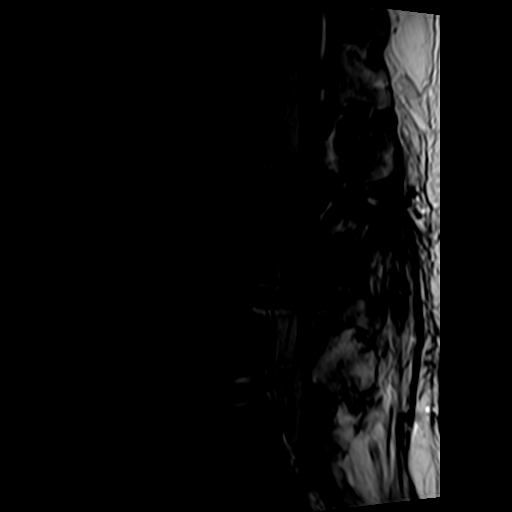
[im 12/15]
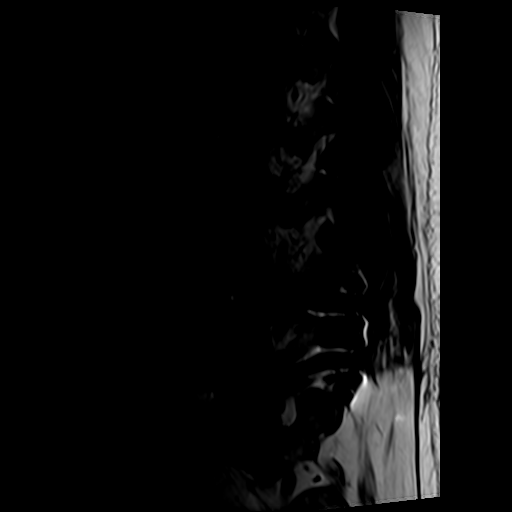
[im 15/15]
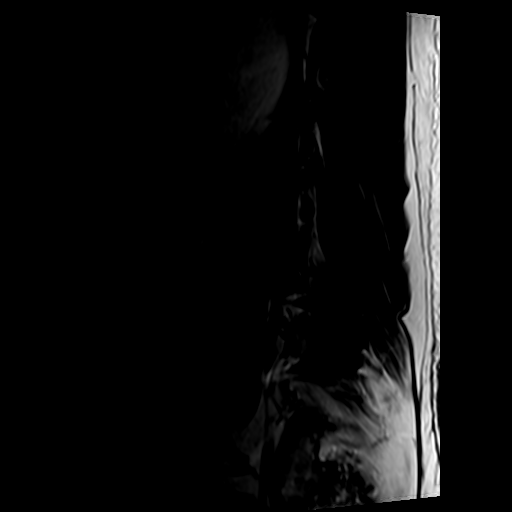

[Series 3: ir_sag_hbw · sagittal · 4.0mm · 0.47mm/px · 6 of 15 slices shown]
[im 1/15]
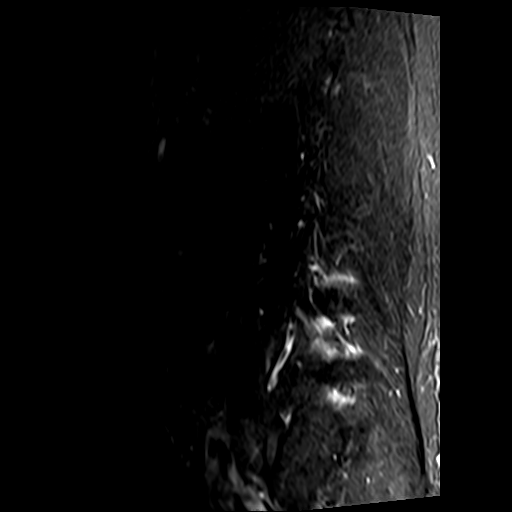
[im 3/15]
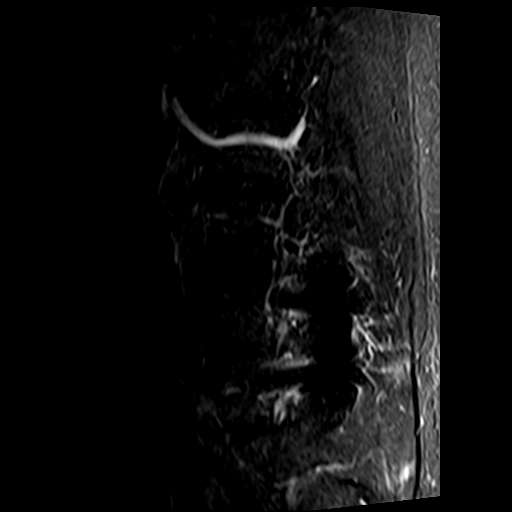
[im 6/15]
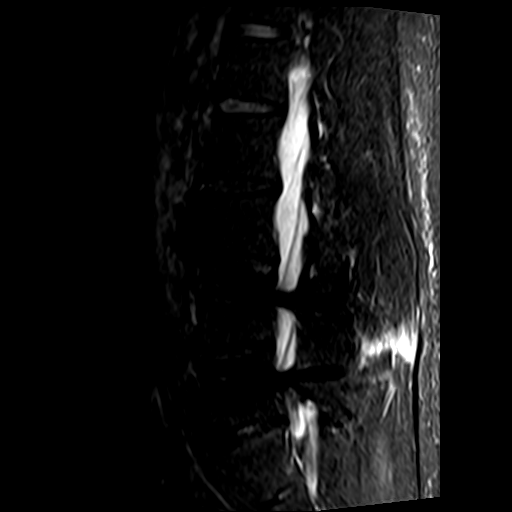
[im 9/15]
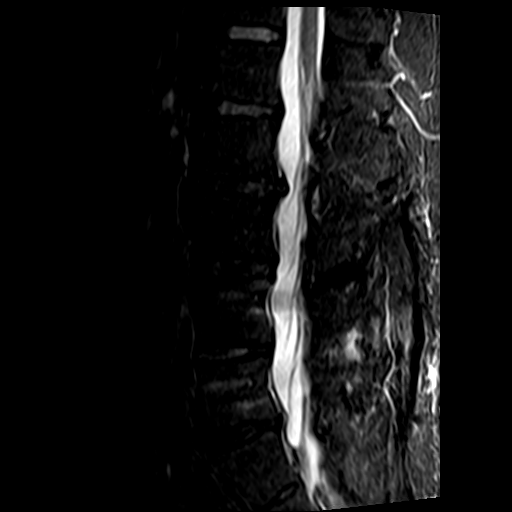
[im 12/15]
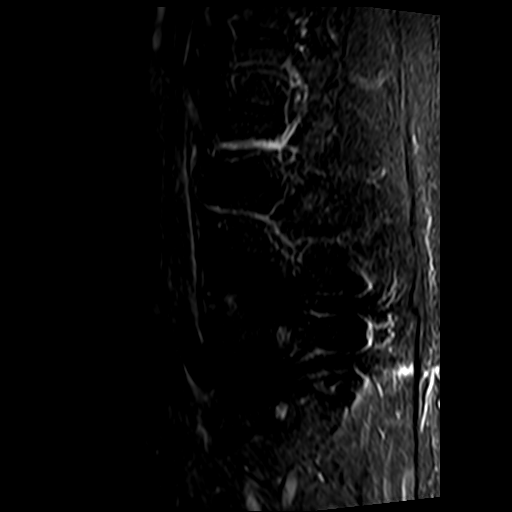
[im 15/15]
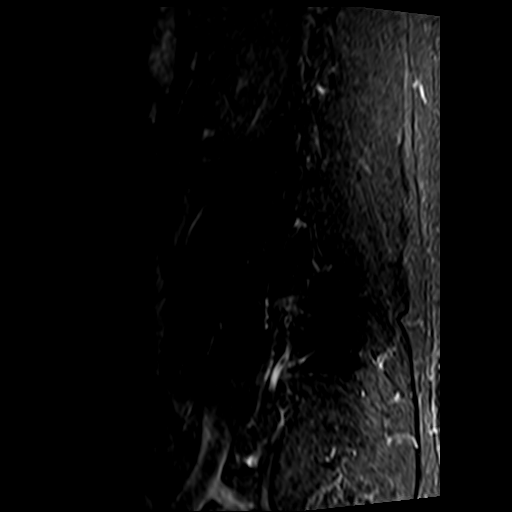

[Series 4: t1_sag_hbw · sagittal · 4.0mm · 0.94mm/px · 6 of 15 slices shown]
[im 1/15]
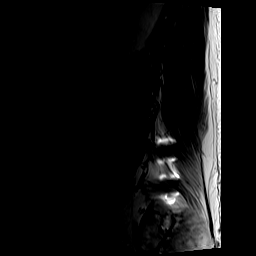
[im 3/15]
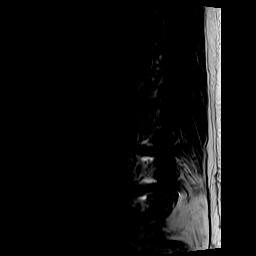
[im 6/15]
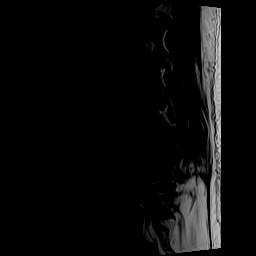
[im 9/15]
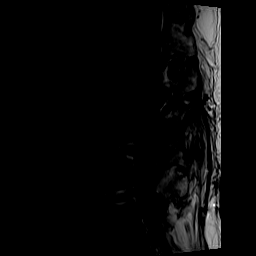
[im 12/15]
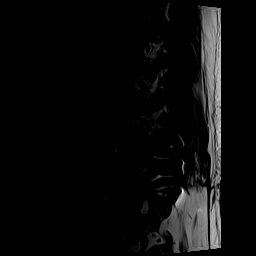
[im 15/15]
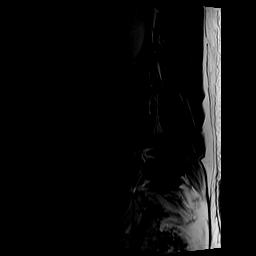

[Series 5: t1_axial_obl_hbw · axial · 4.0mm · 0.86mm/px · z∈[-175,+32]mm · 8 of 25 slices shown]
[im 1/25]
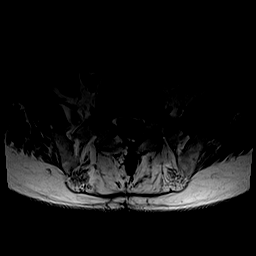
[im 4/25]
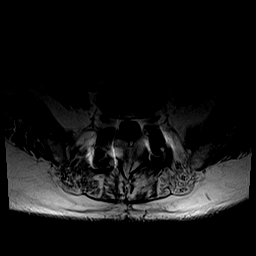
[im 7/25]
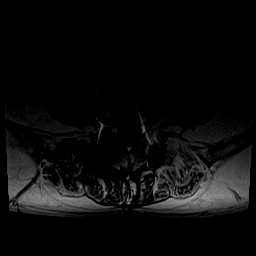
[im 10/25]
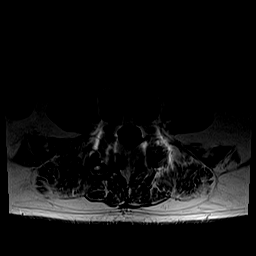
[im 16/25]
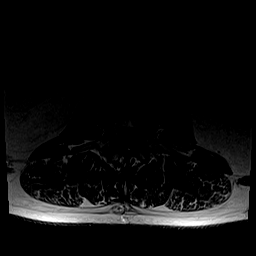
[im 19/25]
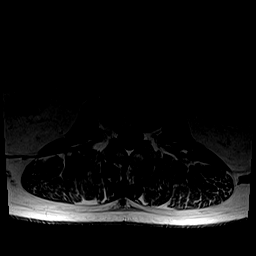
[im 22/25]
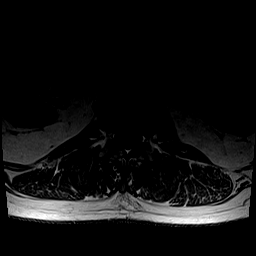
[im 25/25]
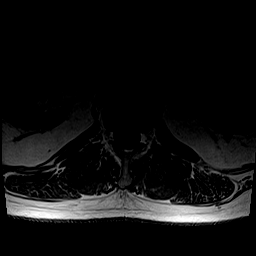

[35 of 48 positions shown; findings below may reference images not displayed]

FINDINGS: Localizer images demonstrate mild lumbar levoscoliosis. No anterolisthesis or retrolisthesis. Postop fusion with pedicle screws and connecting rods at L4-5. Multilevel degenerative disc desiccation. Diffuse disc bulging L2-3 and L5-S1 similar to prior. No abnormal signal in an intradural position. Conus medullaris terminates normally at L1 and maintains normal signal.

L1-2: Mild diffuse disc bulge, slightly more prominent to the left, similar to prior. No significant degree of canal or foraminal stenosis.

L2-3: Mild diffuse disc bulge. Superimposed shallow protrusion right paracentral/right lateral canal position measuring 2.5 mm in AP dimension. This impinges upon the thecal sac producing posterior displacement descending left L3 nerve root as it enters the right lateral recess. No significant canal stenosis. Mild facet arthropathy. No significant foraminal stenosis.

L3-4: No disc bulge or herniation. No canal or foraminal stenosis.

L4-5: Postoperative posterior fusion with pedicle screws and connecting rods. No canal stenosis. No foraminal stenosis.

L5-S1: Mild disc bulge, minimally more prominent to the left. No canal or foraminal stenosis.
IMPRESSION: 1.
Interval development of a shallow protrusion on the right at L2-3 measuring 2.5 mm in AP dimension. This impinges upon the descending right L3 nerve root as it enters the right lateral recess. Correlate for right L3 radiculopathy.

2.
Stable appearance of the postsurgical level at L4-5.

3.
No fractures. No destructive lesions. No visible anomalous segments.

4.
Mild levoscoliosis.

## 2022-11-05 NOTE — Discharge Summary (Signed)
DISCHARGE SUMMARY     Admit date: 11/04/2022  7:25 AM    Discharge date: 11/05/2022       FINAL DIAGNOSES:   Principal Problem:    Acute appendicitis with localized peritonitis, without perforation or abscess, unspecified whether gangrene present       PROCEDURES PERFORMED:   Emergency lap appendectomy       REASON FOR ADMISSION:   Patient presents with:  Abdominal pain: RLQ ABD pain started yesterday  Nausea and vomiting: Vomited twice yesterday   Urinary problem: Pt has been noticing a weaker stream and difficulty urinating, pain when he does urinate         SIGNIFICANT FINDINGS:    LAB:   IMAGING:   CT ABDOMEN PELVIS W CONTRAST    Result Date: 11/04/2022  PROCEDURE: CT ABDOMEN PELVIS W CONTRAST, 11/04/2022 8:12 AM CLINICAL INDICATION:  Pain. COMPARISON: 12/29/2020 TECHNIQUE: Helical CT acquisition performed of the abdomen and pelvis with coronal and sagittal reformats. IV Contrast: 100 ml Omnipaque 300.  PO Contrast: None.  Complications: None. FINDINGS: Visualized chest base: No focal consolidations or pleural effusions. Hepatobiliary system: No suspicious lesions.  No intra- or extrahepatic biliary ductal dilatation. Gallbladder: Gallstones in an otherwise normal-appearing gallbladder. Spleen: Normal size. Pancreas: No focal masses. No pancreatic ductal dilatation. No significant peripancreatic inflammatory changes. Adrenal glands: Unremarkable Kidneys and collecting system: Symmetric nephrograms. No hydronephrosis. 6 mm nonobstructing right renal stone. Bowel and mesentery:No free air No evidence for obstruction. No definite wall thickening. No definite inflammatory fat stranding. No free fluid. No organized fluid collections. Appendix: The appendix measures up to 1.3 cm with slightly increased wall enhancement and inflammatory stranding. Vasculature: Portal and splenic veins patent. Aorta normal caliber with few scattered atheromatous calcifications. Nodes: No significant lymphadenopathy. Pelvic organs:  Unremarkable.  Soft tissues: No significant inflammatory changes.  No evidence for body wall hernia. Bones: No acute fracture. No suspicious bone lesion. L4-5 posterior fusion hardware. Minimal degenerative changes throughout the imaged spine.     IMPRESSION: 1. Suspect early acute appendicitis. No perforation or abscess. 2. Nonobstructing right renal stone. 3. Gallstones. - Total Exam Dose Length Product:  592 mGy-cm Total Exam CT Dose Index:  11 mGy CT Dose reduction techniques were utilized for this examination with 1 or more of the following: Automated exposure control and/or adjustment of the mA or kV according to patient size, and/or use of iterative reconstruction technique. This dictation was performed using voice recognition software and some phonetic and grammatical errors may have been missed in proofreading. Electronically Signed by: Judy Pimple 11/04/2022 8:34 AM    TREATMENT RENDERED:   Surgery as above    HOSPITAL COURSE:   The Patient had a slight uptick of WBC and minimal drop of Hgb after the surgery as expected. He is stable and active. He is walking on his own. Eating soft regular diet. Normal bowel activities. No more right sided pain. He has incisional pain.      CONSULTATIONS:    none    HOSPITAL COMPLICATIONS: None    CONDITION ON DISCHARGE:   stable and improved    MEDICATIONS ON DISCHARGE:     Medication List        TAKE these medications      ibuprofen 800mg  Tab  Commonly known as: MOTRIN  Take one Tab by mouth every 8 hours as needed for Pain or Fever     lisinopril 40mg  Tab  Commonly known as: PRINIVIL, ZESTRIL  Take by mouth daily  methadone 10mg  Tab  Commonly known as: METHADOSE  Take by mouth every 8 hours as needed for Pain             Review of the patient's allergies finds:  Pregabalin (Patient was very Groggy. )  Doxazosin (pain)       PHYSICAL ACTIVITY:   as tolerated  Discharge instructions are given   See the instructions  DIET:   regular    FOLLOW UP CARE:   Dr.  Nedra Hai in 2-3 weeks    DISPOSITION  Home  Tylenol and Ibuprofen for pain control    Amanda Pea, MD

## 2022-12-16 NOTE — Telephone Encounter (Signed)
Patient's spouse Annice Pih called to ask if X-rays and other things had been received by clinic.    NO [facility] on file, so told Annice Pih that there is some documentation here, but did not confirm specifics.

## 2022-12-17 ENCOUNTER — Ambulatory Visit: Admit: 2022-12-17 | Payer: BLUE CROSS/BLUE SHIELD | Attending: Orthopaedic Trauma

## 2022-12-17 DIAGNOSIS — S63649A Sprain of metacarpophalangeal joint of unspecified thumb, initial encounter: Secondary | ICD-10-CM

## 2022-12-17 NOTE — Patient Instructions (Signed)
Wear rigid brace on left thumb at all times except to shower and sleep
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
## 2024-10-19 ENCOUNTER — Other Ambulatory Visit: Admit: 2024-10-19 | Discharge: 2024-10-19 | Payer: BLUE CROSS/BLUE SHIELD

## 2024-10-19 DIAGNOSIS — Z01818 Encounter for other preprocedural examination: Secondary | ICD-10-CM

## 2024-10-19 LAB — CBC WITH AUTO DIFFERENTIAL
Basophils %: 1 % (ref 0–2)
Basophils, Absolute: 0 10*3/ÂµL (ref 0.0–0.2)
Eosinophils %: 6 % (ref 0–7)
Eosinophils, Absolute: 0.4 10*3/ÂµL (ref 0.0–0.7)
HCT: 43.9 % (ref 42.0–54.0)
Hemoglobin: 15.3 g/dL (ref 12.0–18.0)
Lymphocytes %: 23 % — ABNORMAL LOW (ref 25–45)
Lymphocytes, Absolute: 1.4 10*3/ÂµL (ref 1.1–4.3)
MCH: 29.8 pg (ref 27.0–34.0)
MCHC: 34.7 g/dL (ref 32.0–36.0)
MCV: 85.9 fL (ref 81.0–99.0)
MPV: 9.3 fL (ref 7.4–10.4)
Monocytes %: 6 % (ref 0–12)
Monocytes, Absolute: 0.4 10*3/ÂµL (ref 0.0–1.2)
Neutrophils %: 66 % (ref 35–70)
Neutrophils, Absolute: 4.1 10*3/ÂµL (ref 1.6–7.3)
Platelet Count: 189 10*3/ÂµL (ref 150–400)
RBC: 5.11 10*6/ÂµL (ref 4.70–6.10)
RDW: 13.4 % (ref 11.5–14.5)
WBC: 6.3 10*3/ÂµL (ref 4.8–10.8)
